# Patient Record
Sex: Female | Born: 1935 | Race: Black or African American | Hispanic: No | Marital: Single
Health system: Southern US, Community
[De-identification: ages and names within clinical notes are randomized; demographics above are authoritative.]

---

## 2004-10-31 ENCOUNTER — Ambulatory Visit: Payer: Self-pay

## 2005-08-27 ENCOUNTER — Emergency Department: Payer: Self-pay | Admitting: General Practice

## 2005-12-16 ENCOUNTER — Ambulatory Visit: Payer: Self-pay

## 2006-12-15 ENCOUNTER — Ambulatory Visit: Payer: Self-pay | Admitting: Gastroenterology

## 2006-12-22 ENCOUNTER — Ambulatory Visit: Payer: Self-pay

## 2008-01-06 ENCOUNTER — Ambulatory Visit: Payer: Self-pay | Admitting: Family Medicine

## 2008-12-25 ENCOUNTER — Ambulatory Visit: Payer: Self-pay | Admitting: Unknown Physician Specialty

## 2009-01-15 ENCOUNTER — Ambulatory Visit: Payer: Self-pay | Admitting: Family Medicine

## 2010-02-13 ENCOUNTER — Ambulatory Visit: Payer: Self-pay | Admitting: Family Medicine

## 2011-03-18 ENCOUNTER — Ambulatory Visit: Payer: Self-pay | Admitting: Family Medicine

## 2011-04-16 ENCOUNTER — Ambulatory Visit: Payer: Self-pay | Admitting: Family Medicine

## 2011-05-22 ENCOUNTER — Ambulatory Visit: Payer: Self-pay | Admitting: Unknown Physician Specialty

## 2011-05-26 ENCOUNTER — Ambulatory Visit: Payer: Self-pay | Admitting: Unknown Physician Specialty

## 2011-06-06 ENCOUNTER — Ambulatory Visit: Payer: Self-pay | Admitting: Obstetrics and Gynecology

## 2011-06-10 ENCOUNTER — Ambulatory Visit: Payer: Self-pay | Admitting: Gynecologic Oncology

## 2011-06-11 LAB — CEA: CEA: 1.7 ng/mL (ref 0.0–4.7)

## 2011-06-12 ENCOUNTER — Ambulatory Visit: Payer: Self-pay | Admitting: Gynecologic Oncology

## 2011-06-12 ENCOUNTER — Ambulatory Visit: Payer: Self-pay | Admitting: Unknown Physician Specialty

## 2011-06-16 ENCOUNTER — Ambulatory Visit: Payer: Self-pay | Admitting: Gynecologic Oncology

## 2011-06-16 LAB — PATHOLOGY REPORT

## 2011-06-18 LAB — PATHOLOGY REPORT

## 2011-06-21 ENCOUNTER — Inpatient Hospital Stay: Payer: Self-pay | Admitting: Internal Medicine

## 2011-06-23 LAB — CA 125: CA 125: 109 U/mL — ABNORMAL HIGH (ref 0.0–34.0)

## 2011-07-01 ENCOUNTER — Ambulatory Visit: Payer: Self-pay | Admitting: Gynecologic Oncology

## 2011-07-08 ENCOUNTER — Inpatient Hospital Stay: Payer: Self-pay | Admitting: Surgery

## 2011-07-12 ENCOUNTER — Ambulatory Visit: Payer: Self-pay | Admitting: Gynecologic Oncology

## 2011-08-12 ENCOUNTER — Ambulatory Visit: Payer: Self-pay | Admitting: Gynecologic Oncology

## 2011-08-14 LAB — COMPREHENSIVE METABOLIC PANEL
Albumin: 3.6 g/dL (ref 3.4–5.0)
Alkaline Phosphatase: 84 U/L (ref 50–136)
Anion Gap: 9 (ref 7–16)
BUN: 5 mg/dL — ABNORMAL LOW (ref 7–18)
Bilirubin,Total: 0.5 mg/dL (ref 0.2–1.0)
Calcium, Total: 8.7 mg/dL (ref 8.5–10.1)
Co2: 29 mmol/L (ref 21–32)
Creatinine: 0.77 mg/dL (ref 0.60–1.30)
EGFR (African American): >60
EGFR (Non-African Amer.): >60
Glucose: 130 mg/dL — ABNORMAL HIGH (ref 65–99)
Osmolality: 269 (ref 275–301)
Potassium: 3 mmol/L — ABNORMAL LOW (ref 3.5–5.1)
SGPT (ALT): 26 U/L
Sodium: 135 mmol/L — ABNORMAL LOW (ref 136–145)

## 2011-08-14 LAB — CBC CANCER CENTER
Basophil #: 0 x10 3/mm (ref 0.0–0.1)
Basophil %: 1 %
Eosinophil #: 0 x10 3/mm (ref 0.0–0.7)
HCT: 30.9 % — ABNORMAL LOW (ref 35.0–47.0)
HGB: 10.2 g/dL — ABNORMAL LOW (ref 12.0–16.0)
Lymphocyte #: 1.6 x10 3/mm (ref 1.0–3.6)
Lymphocyte %: 48.7 %
MCH: 26.9 pg (ref 26.0–34.0)
MCHC: 32.9 g/dL (ref 32.0–36.0)
MCV: 82 fL (ref 80–100)
Monocyte #: 0.4 x10 3/mm (ref 0.0–0.7)
Neutrophil #: 1.2 x10 3/mm — ABNORMAL LOW (ref 1.4–6.5)
RBC: 3.78 10*6/uL — ABNORMAL LOW (ref 3.80–5.20)

## 2011-08-21 LAB — CBC CANCER CENTER
Basophil #: 0.1 x10 3/mm (ref 0.0–0.1)
Eosinophil #: 0 x10 3/mm (ref 0.0–0.7)
HGB: 10 g/dL — ABNORMAL LOW (ref 12.0–16.0)
Lymphocyte #: 2.2 x10 3/mm (ref 1.0–3.6)
Lymphocyte %: 52.4 %
MCH: 27.1 pg (ref 26.0–34.0)
MCHC: 33.2 g/dL (ref 32.0–36.0)
MCV: 82 fL (ref 80–100)
Monocyte #: 0.4 x10 3/mm (ref 0.0–0.7)
Neutrophil %: 35.4 %
Platelet: 323 x10 3/mm (ref 150–440)
RBC: 3.69 10*6/uL — ABNORMAL LOW (ref 3.80–5.20)
RDW: 20.9 % — ABNORMAL HIGH (ref 11.5–14.5)

## 2011-08-21 LAB — COMPREHENSIVE METABOLIC PANEL
Albumin: 3.5 g/dL (ref 3.4–5.0)
Alkaline Phosphatase: 78 U/L (ref 50–136)
Calcium, Total: 8.7 mg/dL (ref 8.5–10.1)
Chloride: 98 mmol/L (ref 98–107)
Co2: 26 mmol/L (ref 21–32)
Creatinine: 0.83 mg/dL (ref 0.60–1.30)
EGFR (Non-African Amer.): >60
Osmolality: 272 (ref 275–301)
Potassium: 3.5 mmol/L (ref 3.5–5.1)
SGOT(AST): 18 U/L (ref 15–37)
Total Protein: 7.1 g/dL (ref 6.4–8.2)

## 2011-08-28 LAB — BASIC METABOLIC PANEL
Anion Gap: 10 (ref 7–16)
BUN: 6 mg/dL — ABNORMAL LOW (ref 7–18)
Calcium, Total: 8.3 mg/dL — ABNORMAL LOW (ref 8.5–10.1)
Co2: 29 mmol/L (ref 21–32)
Creatinine: 0.73 mg/dL (ref 0.60–1.30)
EGFR (African American): >60
EGFR (Non-African Amer.): >60
Glucose: 124 mg/dL — ABNORMAL HIGH (ref 65–99)
Osmolality: 262 (ref 275–301)
Potassium: 3 mmol/L — ABNORMAL LOW (ref 3.5–5.1)

## 2011-08-28 LAB — CBC CANCER CENTER
Basophil #: 0 x10 3/mm (ref 0.0–0.1)
Basophil %: 0.6 %
Eosinophil #: 0 x10 3/mm (ref 0.0–0.7)
HCT: 28.2 % — ABNORMAL LOW (ref 35.0–47.0)
HGB: 9.4 g/dL — ABNORMAL LOW (ref 12.0–16.0)
Lymphocyte #: 2.1 x10 3/mm (ref 1.0–3.6)
MCH: 27 pg (ref 26.0–34.0)
MCV: 81 fL (ref 80–100)
Monocyte #: 0.3 x10 3/mm (ref 0.0–0.7)
Monocyte %: 8.1 %
Neutrophil #: 1.1 x10 3/mm — ABNORMAL LOW (ref 1.4–6.5)
RBC: 3.48 10*6/uL — ABNORMAL LOW (ref 3.80–5.20)
RDW: 19.9 % — ABNORMAL HIGH (ref 11.5–14.5)
WBC: 3.5 x10 3/mm — ABNORMAL LOW (ref 3.6–11.0)

## 2011-09-04 LAB — CBC CANCER CENTER
Basophil #: 0 x10 3/mm (ref 0.0–0.1)
Basophil %: 0.2 %
Eosinophil #: 0 x10 3/mm (ref 0.0–0.7)
Eosinophil %: 0 %
HCT: 28.9 % — ABNORMAL LOW (ref 35.0–47.0)
HGB: 9.8 g/dL — ABNORMAL LOW (ref 12.0–16.0)
Lymphocyte #: 2 x10 3/mm (ref 1.0–3.6)
MCH: 27.6 pg (ref 26.0–34.0)
MCV: 82 fL (ref 80–100)
Monocyte #: 0.7 x10 3/mm (ref 0.0–0.7)
Monocyte %: 16.3 %
Neutrophil #: 1.3 x10 3/mm — ABNORMAL LOW (ref 1.4–6.5)
Neutrophil %: 32.5 %
Platelet: 177 x10 3/mm (ref 150–440)
RBC: 3.53 10*6/uL — ABNORMAL LOW (ref 3.80–5.20)
RDW: 21.5 % — ABNORMAL HIGH (ref 11.5–14.5)
WBC: 4 x10 3/mm (ref 3.6–11.0)

## 2011-09-04 LAB — COMPREHENSIVE METABOLIC PANEL
Albumin: 3.6 g/dL (ref 3.4–5.0)
Alkaline Phosphatase: 84 U/L (ref 50–136)
Calcium, Total: 8.3 mg/dL — ABNORMAL LOW (ref 8.5–10.1)
Chloride: 98 mmol/L (ref 98–107)
Co2: 27 mmol/L (ref 21–32)
EGFR (African American): >60
EGFR (Non-African Amer.): >60
Glucose: 112 mg/dL — ABNORMAL HIGH (ref 65–99)
Osmolality: 264 (ref 275–301)
SGOT(AST): 18 U/L (ref 15–37)
SGPT (ALT): 26 U/L
Sodium: 133 mmol/L — ABNORMAL LOW (ref 136–145)

## 2011-09-11 LAB — CBC CANCER CENTER
Basophil #: 0 x10 3/mm (ref 0.0–0.1)
Basophil %: 0.1 %
Eosinophil #: 0 x10 3/mm (ref 0.0–0.7)
Eosinophil %: 0.6 %
HCT: 31.6 % — ABNORMAL LOW (ref 35.0–47.0)
HGB: 10.6 g/dL — ABNORMAL LOW (ref 12.0–16.0)
Lymphocyte %: 43.8 %
MCH: 28 pg (ref 26.0–34.0)
MCV: 84 fL (ref 80–100)
Monocyte #: 0.3 x10 3/mm (ref 0.0–0.7)
Monocyte %: 7.2 %
Neutrophil #: 2.2 x10 3/mm (ref 1.4–6.5)
RBC: 3.77 10*6/uL — ABNORMAL LOW (ref 3.80–5.20)
WBC: 4.5 x10 3/mm (ref 3.6–11.0)

## 2011-09-11 LAB — COMPREHENSIVE METABOLIC PANEL
Albumin: 3.9 g/dL (ref 3.4–5.0)
Alkaline Phosphatase: 79 U/L (ref 50–136)
Anion Gap: 8 (ref 7–16)
Calcium, Total: 8.8 mg/dL (ref 8.5–10.1)
Co2: 28 mmol/L (ref 21–32)
Creatinine: 0.76 mg/dL (ref 0.60–1.30)
Glucose: 114 mg/dL — ABNORMAL HIGH (ref 65–99)
Osmolality: 261 (ref 275–301)
Potassium: 3.4 mmol/L — ABNORMAL LOW (ref 3.5–5.1)
SGPT (ALT): 22 U/L
Total Protein: 7.4 g/dL (ref 6.4–8.2)

## 2011-09-12 ENCOUNTER — Ambulatory Visit: Payer: Self-pay | Admitting: Gynecologic Oncology

## 2011-09-25 LAB — CBC CANCER CENTER
Basophil %: 0.1 %
Eosinophil %: 0.4 %
HGB: 10.8 g/dL — ABNORMAL LOW (ref 12.0–16.0)
Lymphocyte %: 44.8 %
MCHC: 33.6 g/dL (ref 32.0–36.0)
MCV: 85 fL (ref 80–100)
Monocyte #: 0.3 x10 3/mm (ref 0.0–0.7)
Neutrophil %: 45 %
Platelet: 278 x10 3/mm (ref 150–440)
RBC: 3.8 10*6/uL (ref 3.80–5.20)
RDW: 24.5 % — ABNORMAL HIGH (ref 11.5–14.5)
WBC: 3.1 x10 3/mm — ABNORMAL LOW (ref 3.6–11.0)

## 2011-09-25 LAB — COMPREHENSIVE METABOLIC PANEL
Albumin: 3.9 g/dL (ref 3.4–5.0)
Alkaline Phosphatase: 79 U/L (ref 50–136)
BUN: 6 mg/dL — ABNORMAL LOW (ref 7–18)
Bilirubin,Total: 0.5 mg/dL (ref 0.2–1.0)
Chloride: 94 mmol/L — ABNORMAL LOW (ref 98–107)
Co2: 25 mmol/L (ref 21–32)
Creatinine: 0.92 mg/dL (ref 0.60–1.30)
EGFR (African American): >60
Glucose: 131 mg/dL — ABNORMAL HIGH (ref 65–99)
Osmolality: 262 (ref 275–301)
SGPT (ALT): 27 U/L
Total Protein: 7.4 g/dL (ref 6.4–8.2)

## 2011-10-02 LAB — COMPREHENSIVE METABOLIC PANEL
Anion Gap: 11 (ref 7–16)
BUN: 7 mg/dL (ref 7–18)
Calcium, Total: 8.5 mg/dL (ref 8.5–10.1)
Co2: 25 mmol/L (ref 21–32)
EGFR (African American): >60
EGFR (Non-African Amer.): >60
Osmolality: 260 (ref 275–301)
Potassium: 3.6 mmol/L (ref 3.5–5.1)
SGOT(AST): 20 U/L (ref 15–37)
Total Protein: 7.4 g/dL (ref 6.4–8.2)

## 2011-10-02 LAB — CBC CANCER CENTER
Basophil %: 0.6 %
Eosinophil %: 0.2 %
HGB: 10.5 g/dL — ABNORMAL LOW (ref 12.0–16.0)
Lymphocyte #: 1.4 x10 3/mm (ref 1.0–3.6)
MCH: 29 pg (ref 26.0–34.0)
MCV: 85 fL (ref 80–100)
Monocyte #: 0.3 x10 3/mm (ref 0.0–0.7)
Neutrophil %: 39.1 %
RBC: 3.61 10*6/uL — ABNORMAL LOW (ref 3.80–5.20)
RDW: 23.2 % — ABNORMAL HIGH (ref 11.5–14.5)
WBC: 2.8 x10 3/mm — ABNORMAL LOW (ref 3.6–11.0)

## 2011-10-09 LAB — CBC CANCER CENTER
Eosinophil %: 0.2 %
HCT: 29.5 % — ABNORMAL LOW (ref 35.0–47.0)
Lymphocyte %: 55.9 %
MCHC: 33.5 g/dL (ref 32.0–36.0)
MCV: 85 fL (ref 80–100)
Monocyte #: 0.4 x10 3/mm (ref 0.0–0.7)
Monocyte %: 11.3 %
Neutrophil #: 1 x10 3/mm — ABNORMAL LOW (ref 1.4–6.5)
Neutrophil %: 32 %
Platelet: 185 x10 3/mm (ref 150–440)
RBC: 3.46 10*6/uL — ABNORMAL LOW (ref 3.80–5.20)
RDW: 23.5 % — ABNORMAL HIGH (ref 11.5–14.5)
WBC: 3.2 x10 3/mm — ABNORMAL LOW (ref 3.6–11.0)

## 2011-10-09 LAB — COMPREHENSIVE METABOLIC PANEL
Anion Gap: 10 (ref 7–16)
BUN: 4 mg/dL — ABNORMAL LOW (ref 7–18)
Bilirubin,Total: 0.3 mg/dL (ref 0.2–1.0)
Calcium, Total: 8.6 mg/dL (ref 8.5–10.1)
Co2: 27 mmol/L (ref 21–32)
EGFR (African American): >60
EGFR (Non-African Amer.): >60
Glucose: 122 mg/dL — ABNORMAL HIGH (ref 65–99)
Osmolality: 265 (ref 275–301)
SGPT (ALT): 27 U/L

## 2011-10-10 ENCOUNTER — Ambulatory Visit: Payer: Self-pay | Admitting: Gynecologic Oncology

## 2011-10-23 LAB — CBC CANCER CENTER
Basophil #: 0 x10 3/mm (ref 0.0–0.1)
Basophil %: 0.4 %
Eosinophil #: 0 x10 3/mm (ref 0.0–0.7)
HCT: 28.5 % — ABNORMAL LOW (ref 35.0–47.0)
MCH: 29.4 pg (ref 26.0–34.0)
MCHC: 33 g/dL (ref 32.0–36.0)
MCV: 89 fL (ref 80–100)
Monocyte #: 0.3 x10 3/mm (ref 0.0–0.7)
Monocyte %: 8.8 %
Neutrophil %: 57.2 %
Platelet: 162 x10 3/mm (ref 150–440)
RBC: 3.2 10*6/uL — ABNORMAL LOW (ref 3.80–5.20)
RDW: 24.3 % — ABNORMAL HIGH (ref 11.5–14.5)
WBC: 4 x10 3/mm (ref 3.6–11.0)

## 2011-10-23 LAB — COMPREHENSIVE METABOLIC PANEL
Anion Gap: 9 (ref 7–16)
BUN: 6 mg/dL — ABNORMAL LOW (ref 7–18)
Calcium, Total: 8.3 mg/dL — ABNORMAL LOW (ref 8.5–10.1)
Chloride: 99 mmol/L (ref 98–107)
Co2: 28 mmol/L (ref 21–32)
Potassium: 3.6 mmol/L (ref 3.5–5.1)
SGOT(AST): 18 U/L (ref 15–37)
SGPT (ALT): 23 U/L
Total Protein: 7.1 g/dL (ref 6.4–8.2)

## 2011-10-30 LAB — CBC CANCER CENTER
Basophil #: 0 x10 3/mm (ref 0.0–0.1)
Eosinophil %: 0.6 %
Lymphocyte #: 1.4 x10 3/mm (ref 1.0–3.6)
Lymphocyte %: 48.2 %
MCHC: 34.1 g/dL (ref 32.0–36.0)
MCV: 88 fL (ref 80–100)
Monocyte %: 8.5 %
Neutrophil #: 1.2 x10 3/mm — ABNORMAL LOW (ref 1.4–6.5)
Neutrophil %: 42.4 %
Platelet: 237 x10 3/mm (ref 150–440)
RBC: 3.2 10*6/uL — ABNORMAL LOW (ref 3.80–5.20)
RDW: 22 % — ABNORMAL HIGH (ref 11.5–14.5)
WBC: 2.8 x10 3/mm — ABNORMAL LOW (ref 3.6–11.0)

## 2011-10-30 LAB — COMPREHENSIVE METABOLIC PANEL
Anion Gap: 10 (ref 7–16)
Bilirubin,Total: 0.5 mg/dL (ref 0.2–1.0)
Chloride: 97 mmol/L — ABNORMAL LOW (ref 98–107)
Co2: 28 mmol/L (ref 21–32)
Creatinine: 0.82 mg/dL (ref 0.60–1.30)
Glucose: 128 mg/dL — ABNORMAL HIGH (ref 65–99)
Potassium: 3.2 mmol/L — ABNORMAL LOW (ref 3.5–5.1)
SGPT (ALT): 22 U/L
Sodium: 135 mmol/L — ABNORMAL LOW (ref 136–145)

## 2011-10-31 LAB — CA 125: CA 125: 11.4 U/mL (ref 0.0–34.0)

## 2011-11-06 LAB — COMPREHENSIVE METABOLIC PANEL
Albumin: 3.8 g/dL (ref 3.4–5.0)
Alkaline Phosphatase: 79 U/L (ref 50–136)
Anion Gap: 9 (ref 7–16)
BUN: 12 mg/dL (ref 7–18)
Bilirubin,Total: 0.3 mg/dL (ref 0.2–1.0)
Calcium, Total: 8.7 mg/dL (ref 8.5–10.1)
Chloride: 98 mmol/L (ref 98–107)
EGFR (Non-African Amer.): >60
Potassium: 3.6 mmol/L (ref 3.5–5.1)
SGOT(AST): 20 U/L (ref 15–37)
SGPT (ALT): 24 U/L
Sodium: 136 mmol/L (ref 136–145)
Total Protein: 7.3 g/dL (ref 6.4–8.2)

## 2011-11-06 LAB — CBC CANCER CENTER
Eosinophil #: 0 x10 3/mm (ref 0.0–0.7)
HCT: 26.3 % — ABNORMAL LOW (ref 35.0–47.0)
Lymphocyte #: 1.6 x10 3/mm (ref 1.0–3.6)
Lymphocyte %: 53 %
MCH: 30.4 pg (ref 26.0–34.0)
MCHC: 34.2 g/dL (ref 32.0–36.0)
MCV: 89 fL (ref 80–100)
Monocyte #: 0.4 x10 3/mm (ref 0.0–0.7)
Monocyte %: 12.2 %
Platelet: 157 x10 3/mm (ref 150–440)
RBC: 2.95 10*6/uL — ABNORMAL LOW (ref 3.80–5.20)
RDW: 21.6 % — ABNORMAL HIGH (ref 11.5–14.5)
WBC: 3 x10 3/mm — ABNORMAL LOW (ref 3.6–11.0)

## 2011-11-10 ENCOUNTER — Ambulatory Visit: Payer: Self-pay | Admitting: Gynecologic Oncology

## 2011-11-20 LAB — CBC CANCER CENTER
Basophil #: 0 x10 3/mm (ref 0.0–0.1)
Basophil %: 0.3 %
Eosinophil #: 0 x10 3/mm (ref 0.0–0.7)
Eosinophil %: 0.9 %
HCT: 26 % — ABNORMAL LOW (ref 35.0–47.0)
HGB: 8.7 g/dL — ABNORMAL LOW (ref 12.0–16.0)
Lymphocyte #: 2.2 x10 3/mm (ref 1.0–3.6)
Lymphocyte %: 38.1 %
MCH: 31.2 pg (ref 26.0–34.0)
MCHC: 33.7 g/dL (ref 32.0–36.0)
Monocyte #: 0.4 x10 3/mm (ref 0.2–0.9)
Neutrophil #: 3 x10 3/mm (ref 1.4–6.5)
Platelet: 163 x10 3/mm (ref 150–440)
RBC: 2.8 10*6/uL — ABNORMAL LOW (ref 3.80–5.20)
RDW: 22.7 % — ABNORMAL HIGH (ref 11.5–14.5)
WBC: 5.6 x10 3/mm (ref 3.6–11.0)

## 2011-11-20 LAB — COMPREHENSIVE METABOLIC PANEL
Albumin: 3.7 g/dL (ref 3.4–5.0)
Anion Gap: 7 (ref 7–16)
BUN: 9 mg/dL (ref 7–18)
Bilirubin,Total: 0.3 mg/dL (ref 0.2–1.0)
Chloride: 99 mmol/L (ref 98–107)
Co2: 28 mmol/L (ref 21–32)
EGFR (Non-African Amer.): >60
Glucose: 113 mg/dL — ABNORMAL HIGH (ref 65–99)
SGOT(AST): 16 U/L (ref 15–37)
SGPT (ALT): 20 U/L
Total Protein: 7.2 g/dL (ref 6.4–8.2)

## 2011-11-27 LAB — COMPREHENSIVE METABOLIC PANEL
Albumin: 3.8 g/dL (ref 3.4–5.0)
Alkaline Phosphatase: 83 U/L (ref 50–136)
Anion Gap: 9 (ref 7–16)
BUN: 11 mg/dL (ref 7–18)
Chloride: 96 mmol/L — ABNORMAL LOW (ref 98–107)
Co2: 30 mmol/L (ref 21–32)
Glucose: 107 mg/dL — ABNORMAL HIGH (ref 65–99)
Osmolality: 270 (ref 275–301)
SGPT (ALT): 23 U/L
Sodium: 135 mmol/L — ABNORMAL LOW (ref 136–145)
Total Protein: 7.3 g/dL (ref 6.4–8.2)

## 2011-11-27 LAB — CBC CANCER CENTER
Basophil #: 0 x10 3/mm (ref 0.0–0.1)
Basophil %: 0.2 %
Eosinophil #: 0 x10 3/mm (ref 0.0–0.7)
Eosinophil %: 0.9 %
Lymphocyte #: 2.2 x10 3/mm (ref 1.0–3.6)
Lymphocyte %: 46.4 %
MCH: 31.5 pg (ref 26.0–34.0)
Monocyte #: 0.4 x10 3/mm (ref 0.2–0.9)
Monocyte %: 9.1 %
Neutrophil #: 2 x10 3/mm (ref 1.4–6.5)
Platelet: 356 x10 3/mm (ref 150–440)
RDW: 20.1 % — ABNORMAL HIGH (ref 11.5–14.5)
WBC: 4.7 x10 3/mm (ref 3.6–11.0)

## 2011-12-04 LAB — COMPREHENSIVE METABOLIC PANEL
Alkaline Phosphatase: 80 U/L (ref 50–136)
Anion Gap: 10 (ref 7–16)
BUN: 12 mg/dL (ref 7–18)
Bilirubin,Total: 0.4 mg/dL (ref 0.2–1.0)
Co2: 28 mmol/L (ref 21–32)
Creatinine: 0.8 mg/dL (ref 0.60–1.30)
EGFR (African American): >60
Glucose: 120 mg/dL — ABNORMAL HIGH (ref 65–99)
Potassium: 3.5 mmol/L (ref 3.5–5.1)
SGOT(AST): 19 U/L (ref 15–37)
SGPT (ALT): 23 U/L
Sodium: 138 mmol/L (ref 136–145)

## 2011-12-04 LAB — CBC CANCER CENTER
Eosinophil #: 0 x10 3/mm (ref 0.0–0.7)
HCT: 25 % — ABNORMAL LOW (ref 35.0–47.0)
HGB: 8.2 g/dL — ABNORMAL LOW (ref 12.0–16.0)
Lymphocyte #: 1.8 x10 3/mm (ref 1.0–3.6)
Lymphocyte %: 40.5 %
MCHC: 32.9 g/dL (ref 32.0–36.0)
MCV: 95 fL (ref 80–100)
Monocyte %: 11 %
Neutrophil #: 2.1 x10 3/mm (ref 1.4–6.5)
Neutrophil %: 48 %
Platelet: 224 x10 3/mm (ref 150–440)
RBC: 2.64 10*6/uL — ABNORMAL LOW (ref 3.80–5.20)
RDW: 20.2 % — ABNORMAL HIGH (ref 11.5–14.5)
WBC: 4.4 x10 3/mm (ref 3.6–11.0)

## 2011-12-10 ENCOUNTER — Ambulatory Visit: Payer: Self-pay | Admitting: Gynecologic Oncology

## 2011-12-18 LAB — CBC CANCER CENTER
Basophil #: 0 x10 3/mm (ref 0.0–0.1)
Eosinophil #: 0.1 x10 3/mm (ref 0.0–0.7)
Eosinophil %: 0.9 %
MCHC: 32.8 g/dL (ref 32.0–36.0)
MCV: 98 fL (ref 80–100)
Monocyte #: 0.5 x10 3/mm (ref 0.2–0.9)
Monocyte %: 8.6 %
Neutrophil #: 3.2 x10 3/mm (ref 1.4–6.5)
Neutrophil %: 57.8 %
Platelet: 159 x10 3/mm (ref 150–440)
WBC: 5.5 x10 3/mm (ref 3.6–11.0)

## 2011-12-18 LAB — COMPREHENSIVE METABOLIC PANEL
Anion Gap: 11 (ref 7–16)
Bilirubin,Total: 0.4 mg/dL (ref 0.2–1.0)
Creatinine: 0.86 mg/dL (ref 0.60–1.30)
EGFR (African American): >60
EGFR (Non-African Amer.): >60
Glucose: 115 mg/dL — ABNORMAL HIGH (ref 65–99)
Osmolality: 278 (ref 275–301)
Potassium: 3.4 mmol/L — ABNORMAL LOW (ref 3.5–5.1)
Total Protein: 7.4 g/dL (ref 6.4–8.2)

## 2011-12-25 LAB — COMPREHENSIVE METABOLIC PANEL
Albumin: 3.7 g/dL (ref 3.4–5.0)
Anion Gap: 11 (ref 7–16)
BUN: 19 mg/dL — ABNORMAL HIGH (ref 7–18)
Bilirubin,Total: 0.6 mg/dL (ref 0.2–1.0)
Calcium, Total: 8.4 mg/dL — ABNORMAL LOW (ref 8.5–10.1)
Co2: 28 mmol/L (ref 21–32)
Creatinine: 0.78 mg/dL (ref 0.60–1.30)
EGFR (African American): >60
SGPT (ALT): 22 U/L
Sodium: 139 mmol/L (ref 136–145)
Total Protein: 7.2 g/dL (ref 6.4–8.2)

## 2011-12-25 LAB — CBC CANCER CENTER
Basophil %: 0.4 %
Eosinophil #: 0 x10 3/mm (ref 0.0–0.7)
HGB: 8.3 g/dL — ABNORMAL LOW (ref 12.0–16.0)
Lymphocyte #: 1.5 x10 3/mm (ref 1.0–3.6)
Lymphocyte %: 47 %
MCHC: 33.4 g/dL (ref 32.0–36.0)
MCV: 97 fL (ref 80–100)
Neutrophil #: 1.4 x10 3/mm (ref 1.4–6.5)
Platelet: 240 x10 3/mm (ref 150–440)
RBC: 2.57 10*6/uL — ABNORMAL LOW (ref 3.80–5.20)
RDW: 18.2 % — ABNORMAL HIGH (ref 11.5–14.5)

## 2012-01-01 LAB — COMPREHENSIVE METABOLIC PANEL
Albumin: 3.8 g/dL (ref 3.4–5.0)
BUN: 9 mg/dL (ref 7–18)
Calcium, Total: 8.4 mg/dL — ABNORMAL LOW (ref 8.5–10.1)
Chloride: 100 mmol/L (ref 98–107)
EGFR (African American): >60
Potassium: 3.5 mmol/L (ref 3.5–5.1)
SGOT(AST): 17 U/L (ref 15–37)
SGPT (ALT): 22 U/L
Sodium: 137 mmol/L (ref 136–145)
Total Protein: 7.3 g/dL (ref 6.4–8.2)

## 2012-01-01 LAB — CBC CANCER CENTER
Basophil #: 0 x10 3/mm (ref 0.0–0.1)
Eosinophil #: 0 x10 3/mm (ref 0.0–0.7)
HCT: 24.6 % — ABNORMAL LOW (ref 35.0–47.0)
HGB: 8.2 g/dL — ABNORMAL LOW (ref 12.0–16.0)
Lymphocyte %: 53.2 %
MCHC: 33.2 g/dL (ref 32.0–36.0)
Monocyte %: 13 %
Neutrophil #: 1.1 x10 3/mm — ABNORMAL LOW (ref 1.4–6.5)
Neutrophil %: 32.9 %
RDW: 17.7 % — ABNORMAL HIGH (ref 11.5–14.5)
WBC: 3.3 x10 3/mm — ABNORMAL LOW (ref 3.6–11.0)

## 2012-01-10 ENCOUNTER — Ambulatory Visit: Payer: Self-pay | Admitting: Gynecologic Oncology

## 2012-02-11 ENCOUNTER — Ambulatory Visit: Payer: Self-pay | Admitting: Oncology

## 2012-02-11 LAB — COMPREHENSIVE METABOLIC PANEL
Albumin: 4.1 g/dL (ref 3.4–5.0)
Anion Gap: 9 (ref 7–16)
Calcium, Total: 9.3 mg/dL (ref 8.5–10.1)
Chloride: 103 mmol/L (ref 98–107)
Co2: 30 mmol/L (ref 21–32)
Creatinine: 0.87 mg/dL (ref 0.60–1.30)
EGFR (Non-African Amer.): >60
Glucose: 96 mg/dL (ref 65–99)
Osmolality: 284 (ref 275–301)
Potassium: 3.7 mmol/L (ref 3.5–5.1)
Sodium: 142 mmol/L (ref 136–145)

## 2012-02-11 LAB — CBC CANCER CENTER
Basophil %: 0.5 %
Eosinophil #: 0.1 x10 3/mm (ref 0.0–0.7)
HGB: 10.9 g/dL — ABNORMAL LOW (ref 12.0–16.0)
Lymphocyte #: 1.8 x10 3/mm (ref 1.0–3.6)
MCHC: 32.3 g/dL (ref 32.0–36.0)
MCV: 95 fL (ref 80–100)
Monocyte #: 0.4 x10 3/mm (ref 0.2–0.9)
Monocyte %: 7.7 %
Neutrophil #: 2.5 x10 3/mm (ref 1.4–6.5)
Neutrophil %: 51.2 %
Platelet: 229 x10 3/mm (ref 150–440)
RBC: 3.54 10*6/uL — ABNORMAL LOW (ref 3.80–5.20)
WBC: 4.8 x10 3/mm (ref 3.6–11.0)

## 2012-02-13 LAB — CA 125: CA 125: 12.2 U/mL (ref 0.0–34.0)

## 2012-03-11 ENCOUNTER — Ambulatory Visit: Payer: Self-pay | Admitting: Oncology

## 2012-04-01 ENCOUNTER — Ambulatory Visit: Payer: Self-pay | Admitting: Family Medicine

## 2012-04-11 ENCOUNTER — Ambulatory Visit: Payer: Self-pay | Admitting: Oncology

## 2012-04-29 ENCOUNTER — Emergency Department: Payer: Self-pay | Admitting: Emergency Medicine

## 2012-04-29 LAB — COMPREHENSIVE METABOLIC PANEL
Albumin: 3.6 g/dL (ref 3.4–5.0)
Alkaline Phosphatase: 78 U/L (ref 50–136)
BUN: 9 mg/dL (ref 7–18)
Bilirubin,Total: 0.4 mg/dL (ref 0.2–1.0)
Creatinine: 0.89 mg/dL (ref 0.60–1.30)
EGFR (Non-African Amer.): >60
Glucose: 129 mg/dL — ABNORMAL HIGH (ref 65–99)
Osmolality: 259 (ref 275–301)
Potassium: 3.8 mmol/L (ref 3.5–5.1)
SGPT (ALT): 17 U/L (ref 12–78)
Sodium: 129 mmol/L — ABNORMAL LOW (ref 136–145)
Total Protein: 7.6 g/dL (ref 6.4–8.2)

## 2012-04-29 LAB — URINALYSIS, COMPLETE
Bacteria: NONE SEEN
Bilirubin,UR: NEGATIVE
Blood: NEGATIVE
Glucose,UR: NEGATIVE mg/dL (ref 0–75)
Ketone: NEGATIVE
Ph: 6 (ref 4.5–8.0)
Protein: NEGATIVE
RBC,UR: 1 /HPF (ref 0–5)
Specific Gravity: 1.016 (ref 1.003–1.030)
Squamous Epithelial: 2

## 2012-04-29 LAB — CBC
HCT: 32.7 % — ABNORMAL LOW (ref 35.0–47.0)
HGB: 10.5 g/dL — ABNORMAL LOW (ref 12.0–16.0)
MCHC: 32.2 g/dL (ref 32.0–36.0)
Platelet: 290 10*3/uL (ref 150–440)
RBC: 4.05 10*6/uL (ref 3.80–5.20)
RDW: 15.8 % — ABNORMAL HIGH (ref 11.5–14.5)
WBC: 11.6 10*3/uL — ABNORMAL HIGH (ref 3.6–11.0)

## 2012-05-11 ENCOUNTER — Ambulatory Visit: Payer: Self-pay | Admitting: Oncology

## 2012-05-13 ENCOUNTER — Inpatient Hospital Stay: Payer: Self-pay | Admitting: Oncology

## 2012-05-13 LAB — CBC WITH DIFFERENTIAL/PLATELET
Basophil #: 0 10*3/uL (ref 0.0–0.1)
Eosinophil #: 0 10*3/uL (ref 0.0–0.7)
HCT: 28.7 % — ABNORMAL LOW (ref 35.0–47.0)
HGB: 9.9 g/dL — ABNORMAL LOW (ref 12.0–16.0)
Lymphocyte #: 1.5 10*3/uL (ref 1.0–3.6)
Lymphocyte %: 19.7 %
MCH: 26.6 pg (ref 26.0–34.0)
MCHC: 34.4 g/dL (ref 32.0–36.0)
MCV: 77 fL — ABNORMAL LOW (ref 80–100)
Monocyte #: 0.9 x10 3/mm (ref 0.2–0.9)
Monocyte %: 12.3 %
Neutrophil #: 5 10*3/uL (ref 1.4–6.5)
Platelet: 326 10*3/uL (ref 150–440)
RDW: 15.5 % — ABNORMAL HIGH (ref 11.5–14.5)
WBC: 7.5 10*3/uL (ref 3.6–11.0)

## 2012-05-13 LAB — URINALYSIS, COMPLETE
Blood: NEGATIVE
Glucose,UR: NEGATIVE mg/dL (ref 0–75)
Nitrite: NEGATIVE
Protein: NEGATIVE
WBC UR: 1 /HPF (ref 0–5)

## 2012-05-13 LAB — COMPREHENSIVE METABOLIC PANEL
Albumin: 3.4 g/dL (ref 3.4–5.0)
Alkaline Phosphatase: 81 U/L (ref 50–136)
BUN: 8 mg/dL (ref 7–18)
Calcium, Total: 8.9 mg/dL (ref 8.5–10.1)
Creatinine: 1.1 mg/dL (ref 0.60–1.30)
Glucose: 89 mg/dL (ref 65–99)
Osmolality: 246 (ref 275–301)
Potassium: 4.1 mmol/L (ref 3.5–5.1)
SGOT(AST): 41 U/L — ABNORMAL HIGH (ref 15–37)
SGPT (ALT): 31 U/L (ref 12–78)
Total Protein: 7.2 g/dL (ref 6.4–8.2)

## 2012-05-14 LAB — CBC WITH DIFFERENTIAL/PLATELET
Basophil %: 0.7 %
Eosinophil %: 0.5 %
HCT: 23.2 % — ABNORMAL LOW (ref 35.0–47.0)
Lymphocyte #: 1.3 10*3/uL (ref 1.0–3.6)
MCH: 26.5 pg (ref 26.0–34.0)
Monocyte #: 0.6 x10 3/mm (ref 0.2–0.9)
Monocyte %: 15 %
Neutrophil #: 2.3 10*3/uL (ref 1.4–6.5)
Platelet: 274 10*3/uL (ref 150–440)
RBC: 3.01 10*6/uL — ABNORMAL LOW (ref 3.80–5.20)
WBC: 4.3 10*3/uL (ref 3.6–11.0)

## 2012-05-14 LAB — IRON AND TIBC
Iron Bind.Cap.(Total): 156 ug/dL — ABNORMAL LOW (ref 250–450)
Iron Saturation: 10 %
Unbound Iron-Bind.Cap.: 140 ug/dL

## 2012-05-14 LAB — AMYLASE: Amylase: 24 U/L — ABNORMAL LOW (ref 25–115)

## 2012-05-14 LAB — LIPASE, BLOOD: Lipase: 51 U/L — ABNORMAL LOW (ref 73–393)

## 2012-05-14 LAB — BASIC METABOLIC PANEL
BUN: 6 mg/dL — ABNORMAL LOW (ref 7–18)
Chloride: 93 mmol/L — ABNORMAL LOW (ref 98–107)
Creatinine: 0.95 mg/dL (ref 0.60–1.30)
EGFR (Non-African Amer.): 59 — ABNORMAL LOW
Glucose: 80 mg/dL (ref 65–99)
Osmolality: 256 (ref 275–301)
Potassium: 3.5 mmol/L (ref 3.5–5.1)

## 2012-05-14 LAB — FERRITIN: Ferritin (ARMC): 320 ng/mL (ref 8–388)

## 2012-05-16 LAB — BASIC METABOLIC PANEL
BUN: 3 mg/dL — ABNORMAL LOW (ref 7–18)
Calcium, Total: 8 mg/dL — ABNORMAL LOW (ref 8.5–10.1)
Co2: 23 mmol/L (ref 21–32)
EGFR (African American): >60
EGFR (Non-African Amer.): >60
Glucose: 118 mg/dL — ABNORMAL HIGH (ref 65–99)
Osmolality: 271 (ref 275–301)
Potassium: 3.6 mmol/L (ref 3.5–5.1)
Sodium: 137 mmol/L (ref 136–145)

## 2012-05-17 LAB — BASIC METABOLIC PANEL
BUN: 2 mg/dL — ABNORMAL LOW (ref 7–18)
Calcium, Total: 8.5 mg/dL (ref 8.5–10.1)
Chloride: 104 mmol/L (ref 98–107)
Glucose: 108 mg/dL — ABNORMAL HIGH (ref 65–99)
Osmolality: 269 (ref 275–301)
Potassium: 3.6 mmol/L (ref 3.5–5.1)
Sodium: 136 mmol/L (ref 136–145)

## 2012-05-18 LAB — BASIC METABOLIC PANEL
Chloride: 107 mmol/L (ref 98–107)
EGFR (Non-African Amer.): >60
Potassium: 4.2 mmol/L (ref 3.5–5.1)
Sodium: 138 mmol/L (ref 136–145)

## 2012-05-18 LAB — CBC WITH DIFFERENTIAL/PLATELET
Basophil #: 0 10*3/uL (ref 0.0–0.1)
HCT: 25.1 % — ABNORMAL LOW (ref 35.0–47.0)
HGB: 8.4 g/dL — ABNORMAL LOW (ref 12.0–16.0)
Lymphocyte #: 1.5 10*3/uL (ref 1.0–3.6)
MCH: 26.7 pg (ref 26.0–34.0)
MCHC: 33.6 g/dL (ref 32.0–36.0)
Monocyte #: 0.6 x10 3/mm (ref 0.2–0.9)
Neutrophil %: 62.8 %
RBC: 3.15 10*6/uL — ABNORMAL LOW (ref 3.80–5.20)
WBC: 5.8 10*3/uL (ref 3.6–11.0)

## 2012-05-20 LAB — CBC WITH DIFFERENTIAL/PLATELET
Basophil #: 0 10*3/uL (ref 0.0–0.1)
Basophil %: 0.2 %
HGB: 8.4 g/dL — ABNORMAL LOW (ref 12.0–16.0)
Lymphocyte #: 1.2 10*3/uL (ref 1.0–3.6)
MCH: 26.7 pg (ref 26.0–34.0)
MCHC: 33.7 g/dL (ref 32.0–36.0)
MCV: 79 fL — ABNORMAL LOW (ref 80–100)
Monocyte %: 9.1 %
Neutrophil %: 70.3 %
Platelet: 285 10*3/uL (ref 150–440)
RBC: 3.13 10*6/uL — ABNORMAL LOW (ref 3.80–5.20)
RDW: 16.3 % — ABNORMAL HIGH (ref 11.5–14.5)
WBC: 5.8 10*3/uL (ref 3.6–11.0)

## 2012-05-20 LAB — COMPREHENSIVE METABOLIC PANEL
Albumin: 2.5 g/dL — ABNORMAL LOW (ref 3.4–5.0)
Alkaline Phosphatase: 63 U/L (ref 50–136)
BUN: 4 mg/dL — ABNORMAL LOW (ref 7–18)
Bilirubin,Total: 0.5 mg/dL (ref 0.2–1.0)
Co2: 22 mmol/L (ref 21–32)
Creatinine: 0.71 mg/dL (ref 0.60–1.30)
EGFR (Non-African Amer.): >60
Glucose: 102 mg/dL — ABNORMAL HIGH (ref 65–99)
SGOT(AST): 17 U/L (ref 15–37)
SGPT (ALT): 16 U/L (ref 12–78)
Sodium: 138 mmol/L (ref 136–145)

## 2012-05-20 LAB — MAGNESIUM: Magnesium: 2 mg/dL

## 2012-05-21 LAB — CBC WITH DIFFERENTIAL/PLATELET
Basophil #: 0 10*3/uL (ref 0.0–0.1)
Basophil %: 0.3 %
Eosinophil %: 0.1 %
HCT: 24.1 % — ABNORMAL LOW (ref 35.0–47.0)
Lymphocyte #: 0.6 10*3/uL — ABNORMAL LOW (ref 1.0–3.6)
Lymphocyte %: 14.1 %
MCH: 26.6 pg (ref 26.0–34.0)
MCV: 80 fL (ref 80–100)
Monocyte %: 5 %
Neutrophil #: 3.7 10*3/uL (ref 1.4–6.5)
Platelet: 279 10*3/uL (ref 150–440)
RBC: 3.03 10*6/uL — ABNORMAL LOW (ref 3.80–5.20)

## 2012-05-22 LAB — CBC WITH DIFFERENTIAL/PLATELET
Basophil %: 0.2 %
Eosinophil %: 0 %
HCT: 23 % — ABNORMAL LOW (ref 35.0–47.0)
HGB: 7.8 g/dL — ABNORMAL LOW (ref 12.0–16.0)
Lymphocyte %: 14 %
MCHC: 33.7 g/dL (ref 32.0–36.0)
MCV: 79 fL — ABNORMAL LOW (ref 80–100)
Monocyte %: 4.7 %
Neutrophil #: 3.7 10*3/uL (ref 1.4–6.5)
RBC: 2.91 10*6/uL — ABNORMAL LOW (ref 3.80–5.20)
WBC: 4.5 10*3/uL (ref 3.6–11.0)

## 2012-05-23 LAB — COMPREHENSIVE METABOLIC PANEL
Alkaline Phosphatase: 65 U/L (ref 50–136)
BUN: 10 mg/dL (ref 7–18)
Bilirubin,Total: 0.8 mg/dL (ref 0.2–1.0)
Chloride: 106 mmol/L (ref 98–107)
Creatinine: 0.79 mg/dL (ref 0.60–1.30)
EGFR (Non-African Amer.): >60
Glucose: 90 mg/dL (ref 65–99)
Osmolality: 278 (ref 275–301)
SGOT(AST): 19 U/L (ref 15–37)
SGPT (ALT): 15 U/L (ref 12–78)
Total Protein: 6.1 g/dL — ABNORMAL LOW (ref 6.4–8.2)

## 2012-05-24 LAB — MAGNESIUM: Magnesium: 1.4 mg/dL — ABNORMAL LOW

## 2012-05-24 LAB — CREATININE, SERUM: EGFR (African American): >60

## 2012-05-25 LAB — CBC WITH DIFFERENTIAL/PLATELET
Basophil %: 0.7 %
Eosinophil #: 0.1 10*3/uL (ref 0.0–0.7)
HCT: 21.4 % — ABNORMAL LOW (ref 35.0–47.0)
HGB: 7.2 g/dL — ABNORMAL LOW (ref 12.0–16.0)
Lymphocyte %: 38.6 %
MCH: 26.7 pg (ref 26.0–34.0)
MCHC: 33.5 g/dL (ref 32.0–36.0)
MCV: 80 fL (ref 80–100)
Monocyte #: 0.1 x10 3/mm — ABNORMAL LOW (ref 0.2–0.9)
Monocyte %: 2.7 %
Neutrophil #: 1.8 10*3/uL (ref 1.4–6.5)
Platelet: 229 10*3/uL (ref 150–440)
RBC: 2.69 10*6/uL — ABNORMAL LOW (ref 3.80–5.20)

## 2012-05-25 LAB — COMPREHENSIVE METABOLIC PANEL
BUN: 7 mg/dL (ref 7–18)
Bilirubin,Total: 0.8 mg/dL (ref 0.2–1.0)
Calcium, Total: 8.2 mg/dL — ABNORMAL LOW (ref 8.5–10.1)
Chloride: 106 mmol/L (ref 98–107)
Co2: 26 mmol/L (ref 21–32)
Creatinine: 0.72 mg/dL (ref 0.60–1.30)
EGFR (African American): >60
EGFR (Non-African Amer.): >60
Osmolality: 278 (ref 275–301)
Potassium: 3.8 mmol/L (ref 3.5–5.1)
SGPT (ALT): 16 U/L (ref 12–78)
Sodium: 141 mmol/L (ref 136–145)

## 2012-05-26 LAB — CBC WITH DIFFERENTIAL/PLATELET
Basophil %: 0.5 %
Eosinophil %: 0.7 %
HGB: 6.7 g/dL — ABNORMAL LOW (ref 12.0–16.0)
Lymphocyte #: 1.3 10*3/uL (ref 1.0–3.6)
MCV: 80 fL (ref 80–100)
Monocyte %: 2.5 %
Neutrophil %: 55.9 %
Platelet: 208 10*3/uL (ref 150–440)
RBC: 2.74 10*6/uL — ABNORMAL LOW (ref 3.80–5.20)
RDW: 16.3 % — ABNORMAL HIGH (ref 11.5–14.5)

## 2012-05-26 LAB — MAGNESIUM: Magnesium: 1.6 mg/dL — ABNORMAL LOW

## 2012-06-03 ENCOUNTER — Ambulatory Visit: Payer: Self-pay | Admitting: Oncology

## 2012-06-03 LAB — CBC CANCER CENTER
Basophil #: 0 x10 3/mm (ref 0.0–0.1)
Eosinophil #: 0 x10 3/mm (ref 0.0–0.7)
HCT: 24.4 % — ABNORMAL LOW (ref 35.0–47.0)
Lymphocyte %: 36.9 %
MCHC: 32.8 g/dL (ref 32.0–36.0)
Monocyte %: 14.1 %
Platelet: 228 x10 3/mm (ref 150–440)
RBC: 3.06 10*6/uL — ABNORMAL LOW (ref 3.80–5.20)
RDW: 16.5 % — ABNORMAL HIGH (ref 11.5–14.5)
WBC: 3.1 x10 3/mm — ABNORMAL LOW (ref 3.6–11.0)

## 2012-06-03 LAB — COMPREHENSIVE METABOLIC PANEL
Alkaline Phosphatase: 87 U/L (ref 50–136)
Anion Gap: 13 (ref 7–16)
Bilirubin,Total: 0.5 mg/dL (ref 0.2–1.0)
Calcium, Total: 8.2 mg/dL — ABNORMAL LOW (ref 8.5–10.1)
Chloride: 92 mmol/L — ABNORMAL LOW (ref 98–107)
Co2: 28 mmol/L (ref 21–32)
Creatinine: 0.84 mg/dL (ref 0.60–1.30)
EGFR (Non-African Amer.): >60
Osmolality: 264 (ref 275–301)
Potassium: 2.7 mmol/L — ABNORMAL LOW (ref 3.5–5.1)
Sodium: 133 mmol/L — ABNORMAL LOW (ref 136–145)

## 2012-06-03 LAB — MAGNESIUM: Magnesium: 0.9 mg/dL — ABNORMAL LOW

## 2012-06-09 ENCOUNTER — Inpatient Hospital Stay: Payer: Self-pay | Admitting: Oncology

## 2012-06-09 LAB — COMPREHENSIVE METABOLIC PANEL
Albumin: 3.7 g/dL (ref 3.4–5.0)
Alkaline Phosphatase: 104 U/L (ref 50–136)
Anion Gap: 14 (ref 7–16)
BUN: 9 mg/dL (ref 7–18)
Bilirubin,Total: 0.5 mg/dL (ref 0.2–1.0)
Calcium, Total: 9.3 mg/dL (ref 8.5–10.1)
Creatinine: 0.89 mg/dL (ref 0.60–1.30)
Glucose: 119 mg/dL — ABNORMAL HIGH (ref 65–99)
Osmolality: 264 (ref 275–301)
Potassium: 3 mmol/L — ABNORMAL LOW (ref 3.5–5.1)
SGOT(AST): 25 U/L (ref 15–37)
Sodium: 132 mmol/L — ABNORMAL LOW (ref 136–145)

## 2012-06-09 LAB — CBC CANCER CENTER
Basophil #: 0 x10 3/mm (ref 0.0–0.1)
Basophil %: 0.3 %
Eosinophil #: 0 x10 3/mm (ref 0.0–0.7)
Eosinophil %: 0.2 %
Lymphocyte %: 39.4 %
Monocyte %: 15.8 %
Neutrophil %: 44.3 %
Platelet: 354 x10 3/mm (ref 150–440)
RBC: 3.57 10*6/uL — ABNORMAL LOW (ref 3.80–5.20)
RDW: 16.5 % — ABNORMAL HIGH (ref 11.5–14.5)
WBC: 3.3 x10 3/mm — ABNORMAL LOW (ref 3.6–11.0)

## 2012-06-09 LAB — BASIC METABOLIC PANEL
Calcium, Total: 8.3 mg/dL — ABNORMAL LOW (ref 8.5–10.1)
Co2: 27 mmol/L (ref 21–32)
EGFR (African American): >60
EGFR (Non-African Amer.): >60
Osmolality: 269 (ref 275–301)
Potassium: 3.2 mmol/L — ABNORMAL LOW (ref 3.5–5.1)
Sodium: 135 mmol/L — ABNORMAL LOW (ref 136–145)

## 2012-06-09 LAB — MAGNESIUM: Magnesium: 2.4 mg/dL

## 2012-06-10 LAB — COMPREHENSIVE METABOLIC PANEL
Albumin: 2.8 g/dL — ABNORMAL LOW (ref 3.4–5.0)
Anion Gap: 10 (ref 7–16)
Bilirubin,Total: 0.3 mg/dL (ref 0.2–1.0)
Calcium, Total: 7.9 mg/dL — ABNORMAL LOW (ref 8.5–10.1)
Chloride: 103 mmol/L (ref 98–107)
Creatinine: 0.59 mg/dL — ABNORMAL LOW (ref 0.60–1.30)
EGFR (African American): >60
EGFR (Non-African Amer.): >60
Glucose: 126 mg/dL — ABNORMAL HIGH (ref 65–99)
Osmolality: 276 (ref 275–301)
SGOT(AST): 24 U/L (ref 15–37)
SGPT (ALT): 19 U/L (ref 12–78)
Sodium: 139 mmol/L (ref 136–145)
Total Protein: 6.5 g/dL (ref 6.4–8.2)

## 2012-06-10 LAB — CBC WITH DIFFERENTIAL/PLATELET
Eosinophil %: 0.2 %
HCT: 21.8 % — ABNORMAL LOW (ref 35.0–47.0)
Lymphocyte %: 45.8 %
MCHC: 35 g/dL (ref 32.0–36.0)
Monocyte %: 18.6 %
Neutrophil %: 35.1 %
Platelet: 291 10*3/uL (ref 150–440)
RBC: 2.77 10*6/uL — ABNORMAL LOW (ref 3.80–5.20)
WBC: 3.2 10*3/uL — ABNORMAL LOW (ref 3.6–11.0)

## 2012-06-10 LAB — MAGNESIUM: Magnesium: 1.5 mg/dL — ABNORMAL LOW

## 2012-06-11 ENCOUNTER — Ambulatory Visit: Payer: Self-pay | Admitting: Oncology

## 2012-06-11 LAB — CBC WITH DIFFERENTIAL/PLATELET
Basophil #: 0 10*3/uL (ref 0.0–0.1)
Eosinophil %: 0.7 %
HCT: 21.8 % — ABNORMAL LOW (ref 35.0–47.0)
Lymphocyte #: 1.6 10*3/uL (ref 1.0–3.6)
MCH: 27.3 pg (ref 26.0–34.0)
MCHC: 34.1 g/dL (ref 32.0–36.0)
MCV: 80 fL (ref 80–100)
Monocyte #: 0.6 x10 3/mm (ref 0.2–0.9)
Monocyte %: 16.7 %
Neutrophil #: 1.1 10*3/uL — ABNORMAL LOW (ref 1.4–6.5)
Neutrophil %: 34.2 %
RBC: 2.72 10*6/uL — ABNORMAL LOW (ref 3.80–5.20)
RDW: 17 % — ABNORMAL HIGH (ref 11.5–14.5)
WBC: 3.3 10*3/uL — ABNORMAL LOW (ref 3.6–11.0)

## 2012-06-11 LAB — BASIC METABOLIC PANEL
Anion Gap: 6 — ABNORMAL LOW (ref 7–16)
BUN: 2 mg/dL — ABNORMAL LOW (ref 7–18)
Calcium, Total: 8.4 mg/dL — ABNORMAL LOW (ref 8.5–10.1)
Chloride: 107 mmol/L (ref 98–107)
Co2: 26 mmol/L (ref 21–32)
Creatinine: 0.67 mg/dL (ref 0.60–1.30)
EGFR (African American): >60
Osmolality: 274 (ref 275–301)

## 2012-06-12 LAB — CBC WITH DIFFERENTIAL/PLATELET
Basophil #: 0 10*3/uL (ref 0.0–0.1)
Basophil %: 0.4 %
Eosinophil #: 0 10*3/uL (ref 0.0–0.7)
HGB: 7.7 g/dL — ABNORMAL LOW (ref 12.0–16.0)
Lymphocyte #: 1.5 10*3/uL (ref 1.0–3.6)
MCH: 26.2 pg (ref 26.0–34.0)
MCV: 79 fL — ABNORMAL LOW (ref 80–100)
Monocyte #: 0.5 x10 3/mm (ref 0.2–0.9)
Monocyte %: 15 %
Neutrophil #: 1.5 10*3/uL (ref 1.4–6.5)
Neutrophil %: 41.3 %
Platelet: 282 10*3/uL (ref 150–440)
RBC: 2.95 10*6/uL — ABNORMAL LOW (ref 3.80–5.20)
RDW: 17.1 % — ABNORMAL HIGH (ref 11.5–14.5)

## 2012-06-12 LAB — BASIC METABOLIC PANEL
Calcium, Total: 8.6 mg/dL (ref 8.5–10.1)
Chloride: 107 mmol/L (ref 98–107)
Co2: 23 mmol/L (ref 21–32)
Creatinine: 0.73 mg/dL (ref 0.60–1.30)
EGFR (African American): >60
EGFR (Non-African Amer.): >60
Glucose: 99 mg/dL (ref 65–99)
Osmolality: 274 (ref 275–301)
Potassium: 4.2 mmol/L (ref 3.5–5.1)

## 2012-06-12 LAB — MAGNESIUM: Magnesium: 1.4 mg/dL — ABNORMAL LOW

## 2012-06-14 LAB — COMPREHENSIVE METABOLIC PANEL
Albumin: 2.5 g/dL — ABNORMAL LOW (ref 3.4–5.0)
Anion Gap: 9 (ref 7–16)
BUN: 2 mg/dL — ABNORMAL LOW (ref 7–18)
Calcium, Total: 8.4 mg/dL — ABNORMAL LOW (ref 8.5–10.1)
Chloride: 103 mmol/L (ref 98–107)
EGFR (African American): >60
EGFR (Non-African Amer.): >60
Glucose: 119 mg/dL — ABNORMAL HIGH (ref 65–99)
Potassium: 3.8 mmol/L (ref 3.5–5.1)
SGOT(AST): 13 U/L — ABNORMAL LOW (ref 15–37)
SGPT (ALT): 14 U/L (ref 12–78)

## 2012-06-14 LAB — CBC WITH DIFFERENTIAL/PLATELET
Basophil #: 0 10*3/uL (ref 0.0–0.1)
Basophil %: 0.6 %
HCT: 22 % — ABNORMAL LOW (ref 35.0–47.0)
HGB: 7.2 g/dL — ABNORMAL LOW (ref 12.0–16.0)
Lymphocyte %: 33.9 %
MCHC: 32.6 g/dL (ref 32.0–36.0)
Monocyte %: 15.2 %
Neutrophil #: 2 10*3/uL (ref 1.4–6.5)
Neutrophil %: 49.5 %
Platelet: 243 10*3/uL (ref 150–440)
RBC: 2.79 10*6/uL — ABNORMAL LOW (ref 3.80–5.20)
RDW: 17.7 % — ABNORMAL HIGH (ref 11.5–14.5)
WBC: 4 10*3/uL (ref 3.6–11.0)

## 2012-06-14 LAB — MAGNESIUM: Magnesium: 0.9 mg/dL — ABNORMAL LOW

## 2012-06-15 LAB — BASIC METABOLIC PANEL
BUN: 2 mg/dL — ABNORMAL LOW (ref 7–18)
Calcium, Total: 8.4 mg/dL — ABNORMAL LOW (ref 8.5–10.1)
Chloride: 107 mmol/L (ref 98–107)
Creatinine: 0.76 mg/dL (ref 0.60–1.30)
EGFR (African American): >60
EGFR (Non-African Amer.): >60
Glucose: 105 mg/dL — ABNORMAL HIGH (ref 65–99)
Osmolality: 278 (ref 275–301)
Potassium: 3.8 mmol/L (ref 3.5–5.1)

## 2012-06-15 LAB — CBC WITH DIFFERENTIAL/PLATELET
Basophil %: 0.5 %
Eosinophil %: 0.7 %
HCT: 21.8 % — ABNORMAL LOW (ref 35.0–47.0)
HGB: 7.2 g/dL — ABNORMAL LOW (ref 12.0–16.0)
Lymphocyte #: 1.3 10*3/uL (ref 1.0–3.6)
MCH: 26.1 pg (ref 26.0–34.0)
MCHC: 33.1 g/dL (ref 32.0–36.0)
MCV: 79 fL — ABNORMAL LOW (ref 80–100)
Monocyte #: 0.6 x10 3/mm (ref 0.2–0.9)
Neutrophil #: 2.3 10*3/uL (ref 1.4–6.5)
Neutrophil %: 54.2 %
RBC: 2.76 10*6/uL — ABNORMAL LOW (ref 3.80–5.20)
WBC: 4.2 10*3/uL (ref 3.6–11.0)

## 2012-06-15 LAB — MAGNESIUM: Magnesium: 1.6 mg/dL — ABNORMAL LOW

## 2012-06-16 LAB — MAGNESIUM: Magnesium: 1.6 mg/dL — ABNORMAL LOW

## 2012-06-17 LAB — MAGNESIUM: Magnesium: 1.8 mg/dL

## 2012-06-19 LAB — MAGNESIUM: Magnesium: 1.5 mg/dL — ABNORMAL LOW

## 2012-06-20 LAB — MAGNESIUM: Magnesium: 1.5 mg/dL — ABNORMAL LOW

## 2012-06-21 LAB — MAGNESIUM: Magnesium: 1.6 mg/dL — ABNORMAL LOW

## 2012-06-21 LAB — POTASSIUM: Potassium: 3.9 mmol/L (ref 3.5–5.1)

## 2012-06-22 LAB — CBC WITH DIFFERENTIAL/PLATELET
Basophil #: 0 10*3/uL (ref 0.0–0.1)
Basophil %: 0.3 %
Eosinophil #: 0 10*3/uL (ref 0.0–0.7)
HCT: 26.6 % — ABNORMAL LOW (ref 35.0–47.0)
HGB: 8.1 g/dL — ABNORMAL LOW (ref 12.0–16.0)
Lymphocyte #: 1.2 10*3/uL (ref 1.0–3.6)
Lymphocyte %: 12.1 %
MCH: 24.5 pg — ABNORMAL LOW (ref 26.0–34.0)
MCHC: 30.4 g/dL — ABNORMAL LOW (ref 32.0–36.0)
MCV: 81 fL (ref 80–100)
Monocyte #: 0.8 x10 3/mm (ref 0.2–0.9)
Monocyte %: 8.1 %
Platelet: 458 10*3/uL — ABNORMAL HIGH (ref 150–440)
RDW: 19.5 % — ABNORMAL HIGH (ref 11.5–14.5)
WBC: 9.8 10*3/uL (ref 3.6–11.0)

## 2012-06-22 LAB — COMPREHENSIVE METABOLIC PANEL
Albumin: 2.8 g/dL — ABNORMAL LOW (ref 3.4–5.0)
Alkaline Phosphatase: 72 U/L (ref 50–136)
BUN: 8 mg/dL (ref 7–18)
Bilirubin,Total: 0.3 mg/dL (ref 0.2–1.0)
Chloride: 107 mmol/L (ref 98–107)
Creatinine: 0.64 mg/dL (ref 0.60–1.30)
EGFR (African American): >60
EGFR (Non-African Amer.): >60
SGOT(AST): 24 U/L (ref 15–37)
SGPT (ALT): 20 U/L (ref 12–78)
Sodium: 140 mmol/L (ref 136–145)
Total Protein: 6.7 g/dL (ref 6.4–8.2)

## 2012-06-23 LAB — CA 125: CA 125: 186.4 U/mL — ABNORMAL HIGH (ref 0.0–34.0)

## 2012-06-24 LAB — CBC WITH DIFFERENTIAL/PLATELET
Basophil %: 0.5 %
Eosinophil #: 0 10*3/uL (ref 0.0–0.7)
Eosinophil %: 0 %
HGB: 8.7 g/dL — ABNORMAL LOW (ref 12.0–16.0)
Lymphocyte %: 16.7 %
MCH: 26.2 pg (ref 26.0–34.0)
Monocyte #: 0.8 x10 3/mm (ref 0.2–0.9)
Monocyte %: 14 %
Neutrophil #: 3.8 10*3/uL (ref 1.4–6.5)
Neutrophil %: 68.8 %
RBC: 3.31 10*6/uL — ABNORMAL LOW (ref 3.80–5.20)

## 2012-06-24 LAB — COMPREHENSIVE METABOLIC PANEL
Alkaline Phosphatase: 66 U/L (ref 50–136)
BUN: 18 mg/dL (ref 7–18)
Calcium, Total: 8.6 mg/dL (ref 8.5–10.1)
Chloride: 100 mmol/L (ref 98–107)
Co2: 25 mmol/L (ref 21–32)
Creatinine: 0.71 mg/dL (ref 0.60–1.30)
EGFR (Non-African Amer.): >60
Potassium: 3.8 mmol/L (ref 3.5–5.1)
SGOT(AST): 25 U/L (ref 15–37)
SGPT (ALT): 31 U/L (ref 12–78)
Sodium: 134 mmol/L — ABNORMAL LOW (ref 136–145)
Total Protein: 6.5 g/dL (ref 6.4–8.2)

## 2012-06-24 LAB — PHOSPHORUS: Phosphorus: 3.6 mg/dL (ref 2.5–4.9)

## 2012-06-25 LAB — POTASSIUM: Potassium: 3.1 mmol/L — ABNORMAL LOW (ref 3.5–5.1)

## 2012-06-25 LAB — PHOSPHORUS: Phosphorus: 3.1 mg/dL (ref 2.5–4.9)

## 2012-06-25 LAB — SODIUM: Sodium: 137 mmol/L (ref 136–145)

## 2012-06-25 LAB — MAGNESIUM: Magnesium: 1.6 mg/dL — ABNORMAL LOW

## 2012-06-25 LAB — CALCIUM: Calcium, Total: 8.2 mg/dL — ABNORMAL LOW (ref 8.5–10.1)

## 2012-06-26 LAB — CALCIUM: Calcium, Total: 8.4 mg/dL — ABNORMAL LOW (ref 8.5–10.1)

## 2012-06-26 LAB — PHOSPHORUS: Phosphorus: 4.1 mg/dL (ref 2.5–4.9)

## 2012-06-26 LAB — MAGNESIUM: Magnesium: 2.1 mg/dL

## 2012-06-26 LAB — POTASSIUM: Potassium: 4 mmol/L (ref 3.5–5.1)

## 2012-06-27 LAB — POTASSIUM: Potassium: 3.8 mmol/L (ref 3.5–5.1)

## 2012-06-27 LAB — MAGNESIUM: Magnesium: 1.4 mg/dL — ABNORMAL LOW

## 2012-06-27 LAB — CALCIUM: Calcium, Total: 8.4 mg/dL — ABNORMAL LOW (ref 8.5–10.1)

## 2012-06-27 LAB — PHOSPHORUS: Phosphorus: 2.2 mg/dL — ABNORMAL LOW (ref 2.5–4.9)

## 2012-06-28 LAB — CALCIUM: Calcium, Total: 8.6 mg/dL (ref 8.5–10.1)

## 2012-06-28 LAB — PHOSPHORUS: Phosphorus: 3.2 mg/dL (ref 2.5–4.9)

## 2012-06-28 LAB — POTASSIUM: Potassium: 3.7 mmol/L (ref 3.5–5.1)

## 2012-06-29 LAB — CBC WITH DIFFERENTIAL/PLATELET
Basophil #: 0 10*3/uL (ref 0.0–0.1)
Basophil %: 0.2 %
Eosinophil #: 0 10*3/uL (ref 0.0–0.7)
HCT: 23.3 % — ABNORMAL LOW (ref 35.0–47.0)
HGB: 7.6 g/dL — ABNORMAL LOW (ref 12.0–16.0)
Lymphocyte %: 20.5 %
MCH: 25.7 pg — ABNORMAL LOW (ref 26.0–34.0)
MCHC: 32.5 g/dL (ref 32.0–36.0)
MCV: 79 fL — ABNORMAL LOW (ref 80–100)
Monocyte #: 0.1 x10 3/mm — ABNORMAL LOW (ref 0.2–0.9)
RBC: 2.96 10*6/uL — ABNORMAL LOW (ref 3.80–5.20)
RDW: 19.8 % — ABNORMAL HIGH (ref 11.5–14.5)

## 2012-06-29 LAB — PHOSPHORUS: Phosphorus: 3.3 mg/dL (ref 2.5–4.9)

## 2012-06-29 LAB — POTASSIUM: Potassium: 3.6 mmol/L (ref 3.5–5.1)

## 2012-07-02 LAB — COMPREHENSIVE METABOLIC PANEL
Albumin: 3.4 g/dL (ref 3.4–5.0)
Anion Gap: 11 (ref 7–16)
BUN: 20 mg/dL — ABNORMAL HIGH (ref 7–18)
Calcium, Total: 9.1 mg/dL (ref 8.5–10.1)
Chloride: 95 mmol/L — ABNORMAL LOW (ref 98–107)
Co2: 28 mmol/L (ref 21–32)
EGFR (African American): 51 — ABNORMAL LOW
EGFR (Non-African Amer.): 44 — ABNORMAL LOW
Glucose: 99 mg/dL (ref 65–99)
Osmolality: 271 (ref 275–301)
Potassium: 3.8 mmol/L (ref 3.5–5.1)
SGOT(AST): 42 U/L — ABNORMAL HIGH (ref 15–37)
Sodium: 134 mmol/L — ABNORMAL LOW (ref 136–145)
Total Protein: 6.9 g/dL (ref 6.4–8.2)

## 2012-07-02 LAB — CBC CANCER CENTER
Basophil %: 0.4 %
Eosinophil #: 0 x10 3/mm (ref 0.0–0.7)
Eosinophil %: 0.1 %
Lymphocyte #: 2.1 x10 3/mm (ref 1.0–3.6)
Lymphocyte %: 58.8 %
MCH: 25 pg — ABNORMAL LOW (ref 26.0–34.0)
MCHC: 31.3 g/dL — ABNORMAL LOW (ref 32.0–36.0)
Monocyte #: 0.2 x10 3/mm (ref 0.2–0.9)
Neutrophil %: 34.5 %
Platelet: 93 x10 3/mm — ABNORMAL LOW (ref 150–440)
RBC: 3.17 10*6/uL — ABNORMAL LOW (ref 3.80–5.20)

## 2012-07-02 LAB — MAGNESIUM: Magnesium: 1.1 mg/dL — ABNORMAL LOW

## 2012-07-08 ENCOUNTER — Inpatient Hospital Stay: Payer: Self-pay | Admitting: Internal Medicine

## 2012-07-08 LAB — CBC WITH DIFFERENTIAL/PLATELET
Basophil %: 0.2 %
Eosinophil %: 0 %
HCT: 30.7 % — ABNORMAL LOW (ref 35.0–47.0)
HGB: 10.2 g/dL — ABNORMAL LOW (ref 12.0–16.0)
Lymphocyte #: 0.8 10*3/uL — ABNORMAL LOW (ref 1.0–3.6)
MCH: 27 pg (ref 26.0–34.0)
MCV: 81 fL (ref 80–100)
Monocyte #: 0.2 x10 3/mm (ref 0.2–0.9)
Neutrophil #: 4.3 10*3/uL (ref 1.4–6.5)
Platelet: 141 10*3/uL — ABNORMAL LOW (ref 150–440)
RBC: 3.79 10*6/uL — ABNORMAL LOW (ref 3.80–5.20)

## 2012-07-08 LAB — COMPREHENSIVE METABOLIC PANEL
Albumin: 3.5 g/dL (ref 3.4–5.0)
Alkaline Phosphatase: 70 U/L (ref 50–136)
Anion Gap: 8 (ref 7–16)
BUN: 18 mg/dL (ref 7–18)
Bilirubin,Total: 0.6 mg/dL (ref 0.2–1.0)
Chloride: 94 mmol/L — ABNORMAL LOW (ref 98–107)
Co2: 27 mmol/L (ref 21–32)
Creatinine: 1.05 mg/dL (ref 0.60–1.30)
EGFR (African American): >60
EGFR (Non-African Amer.): 52 — ABNORMAL LOW
SGOT(AST): 43 U/L — ABNORMAL HIGH (ref 15–37)
SGPT (ALT): 114 U/L — ABNORMAL HIGH (ref 12–78)
Sodium: 129 mmol/L — ABNORMAL LOW (ref 136–145)
Total Protein: 6.9 g/dL (ref 6.4–8.2)

## 2012-07-08 LAB — URINALYSIS, COMPLETE
Nitrite: NEGATIVE
Protein: NEGATIVE
RBC,UR: 3 /HPF (ref 0–5)
WBC UR: 3 /HPF (ref 0–5)

## 2012-07-08 LAB — CK TOTAL AND CKMB (NOT AT ARMC)
CK, Total: 26 U/L (ref 21–215)
CK-MB: 1.8 ng/mL (ref 0.5–3.6)

## 2012-07-09 LAB — CBC WITH DIFFERENTIAL/PLATELET
Basophil #: 0 10*3/uL (ref 0.0–0.1)
Basophil %: 0.3 %
Eosinophil #: 0 10*3/uL (ref 0.0–0.7)
Eosinophil %: 0.3 %
HGB: 8.6 g/dL — ABNORMAL LOW (ref 12.0–16.0)
Lymphocyte #: 1.7 10*3/uL (ref 1.0–3.6)
Lymphocyte %: 31.7 %
MCV: 81 fL (ref 80–100)
Monocyte #: 0.3 x10 3/mm (ref 0.2–0.9)
Neutrophil %: 62.3 %
Platelet: 139 10*3/uL — ABNORMAL LOW (ref 150–440)
RBC: 3.25 10*6/uL — ABNORMAL LOW (ref 3.80–5.20)
RDW: 22.7 % — ABNORMAL HIGH (ref 11.5–14.5)
WBC: 5.5 10*3/uL (ref 3.6–11.0)

## 2012-07-09 LAB — BASIC METABOLIC PANEL
Chloride: 100 mmol/L (ref 98–107)
Co2: 22 mmol/L (ref 21–32)
EGFR (African American): >60
Glucose: 79 mg/dL (ref 65–99)
Sodium: 134 mmol/L — ABNORMAL LOW (ref 136–145)

## 2012-07-10 LAB — CBC WITH DIFFERENTIAL/PLATELET
Basophil %: 0.6 %
Eosinophil #: 0 10*3/uL (ref 0.0–0.7)
Eosinophil %: 0 %
HGB: 8.3 g/dL — ABNORMAL LOW (ref 12.0–16.0)
Lymphocyte #: 0.7 10*3/uL — ABNORMAL LOW (ref 1.0–3.6)
MCH: 26.9 pg (ref 26.0–34.0)
MCV: 82 fL (ref 80–100)
Monocyte #: 0.2 x10 3/mm (ref 0.2–0.9)
Neutrophil #: 4.1 10*3/uL (ref 1.4–6.5)
Platelet: 144 10*3/uL — ABNORMAL LOW (ref 150–440)
RBC: 3.08 10*6/uL — ABNORMAL LOW (ref 3.80–5.20)

## 2012-07-10 LAB — BASIC METABOLIC PANEL
Anion Gap: 11 (ref 7–16)
BUN: 17 mg/dL (ref 7–18)
Chloride: 104 mmol/L (ref 98–107)
Co2: 21 mmol/L (ref 21–32)
Creatinine: 0.63 mg/dL (ref 0.60–1.30)
Osmolality: 273 (ref 275–301)
Sodium: 136 mmol/L (ref 136–145)

## 2012-07-10 LAB — MAGNESIUM
Magnesium: 1.1 mg/dL — ABNORMAL LOW
Magnesium: 1.5 mg/dL — ABNORMAL LOW

## 2012-07-11 ENCOUNTER — Ambulatory Visit: Payer: Self-pay | Admitting: Oncology

## 2012-07-11 LAB — HEPATIC FUNCTION PANEL A (ARMC)
Albumin: 2.8 g/dL — ABNORMAL LOW (ref 3.4–5.0)
Alkaline Phosphatase: 59 U/L (ref 50–136)
Bilirubin, Direct: 0.1 mg/dL (ref 0.00–0.20)
SGOT(AST): 26 U/L (ref 15–37)
SGPT (ALT): 94 U/L — ABNORMAL HIGH (ref 12–78)

## 2012-07-11 LAB — CBC WITH DIFFERENTIAL/PLATELET
Basophil %: 0.4 %
Eosinophil %: 0 %
HCT: 25.1 % — ABNORMAL LOW (ref 35.0–47.0)
HGB: 8.1 g/dL — ABNORMAL LOW (ref 12.0–16.0)
Lymphocyte #: 0.9 10*3/uL — ABNORMAL LOW (ref 1.0–3.6)
MCH: 26.2 pg (ref 26.0–34.0)
MCHC: 32.3 g/dL (ref 32.0–36.0)
MCV: 81 fL (ref 80–100)
Monocyte #: 0.2 x10 3/mm (ref 0.2–0.9)
Monocyte %: 4.8 %
Platelet: 133 10*3/uL — ABNORMAL LOW (ref 150–440)
RBC: 3.09 10*6/uL — ABNORMAL LOW (ref 3.80–5.20)
WBC: 5 10*3/uL (ref 3.6–11.0)

## 2012-07-11 LAB — MAGNESIUM: Magnesium: 1.5 mg/dL — ABNORMAL LOW

## 2012-07-12 LAB — FERRITIN: Ferritin (ARMC): 1395 ng/mL — ABNORMAL HIGH (ref 8–388)

## 2012-07-14 LAB — CULTURE, BLOOD (SINGLE)

## 2012-08-11 ENCOUNTER — Ambulatory Visit: Payer: Self-pay | Admitting: Oncology

## 2012-08-11 DEATH — deceased

## 2013-10-31 IMAGING — PT NM PET TUM IMG LTD AREA
5 series · 25 of 25 positions shown · non-contrast
Comparison: none

REASON FOR EXAM: Adenocarcinoma of unknown primary, likely ovarian
origin. Patient presents with
COMMENTS:

PROCEDURE:     PET - PET/CT DX OVARIAN CA  - June 23, 2011  [DATE]
RESULT:
Procedure: Total body PET was performed in conjunction with a non-attenuated
CT status post left hand-injection of 11.1 mCi of F-18 labeled
fluorodeoxyglucose.

[Series 3: ct wb 3.0 b30f · axial · 3.0mm · 0.98mm/px · z∈[-1418,-550]mm · 9 of 435 slices shown]
[im 1/435  soft-tissue]
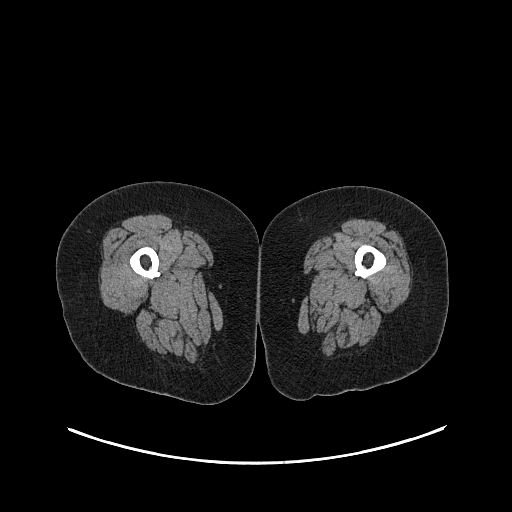
[im 55/435  soft-tissue]
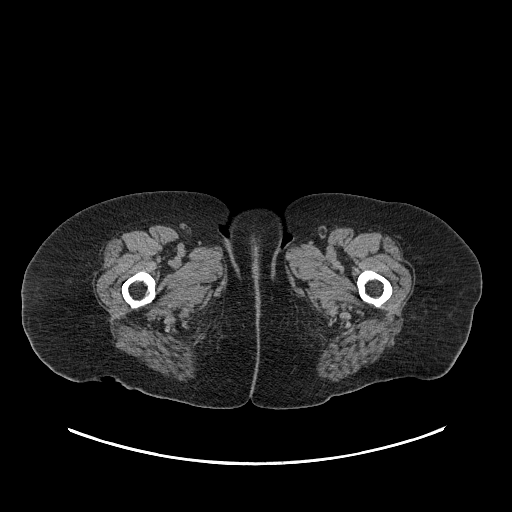
[im 109/435  soft-tissue]
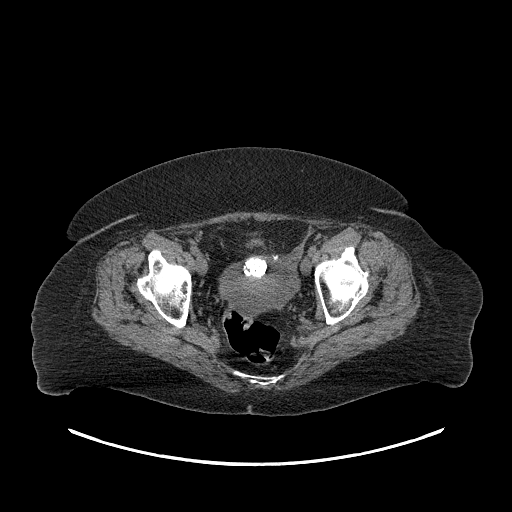
[im 163/435  soft-tissue]
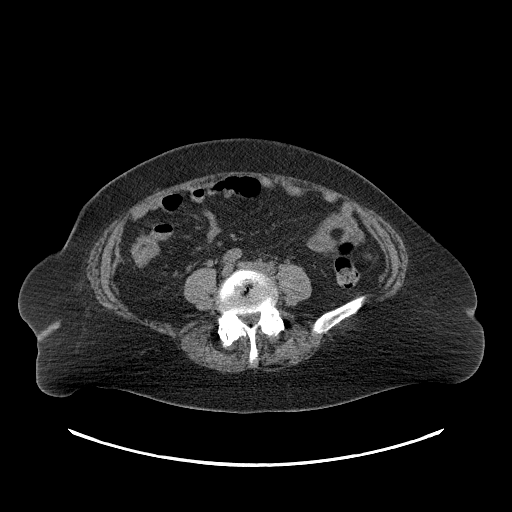
[im 218/435  soft-tissue]
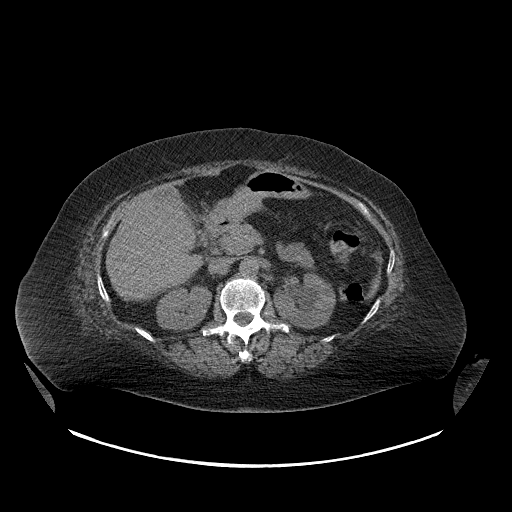
[im 272/435  soft-tissue]
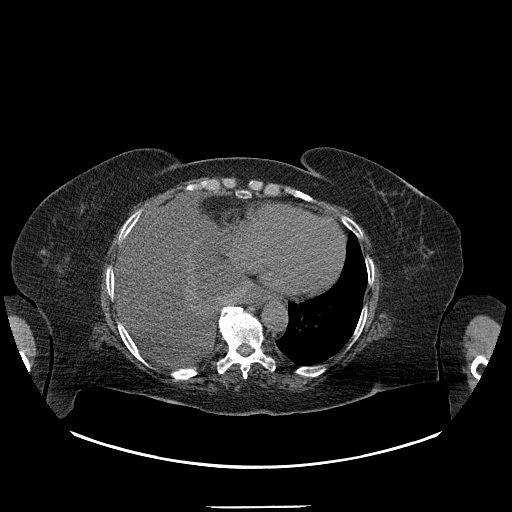
[im 326/435  soft-tissue]
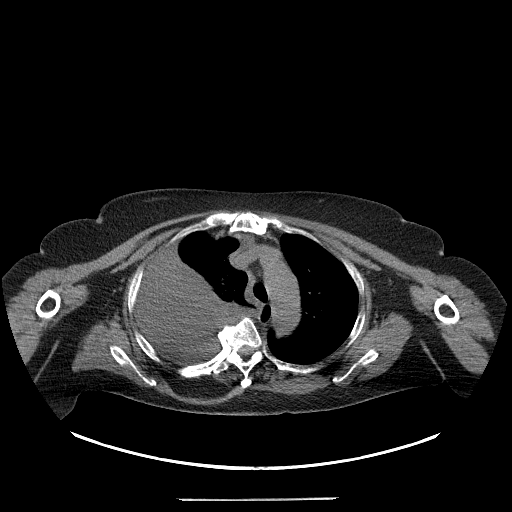
[im 380/435  soft-tissue]
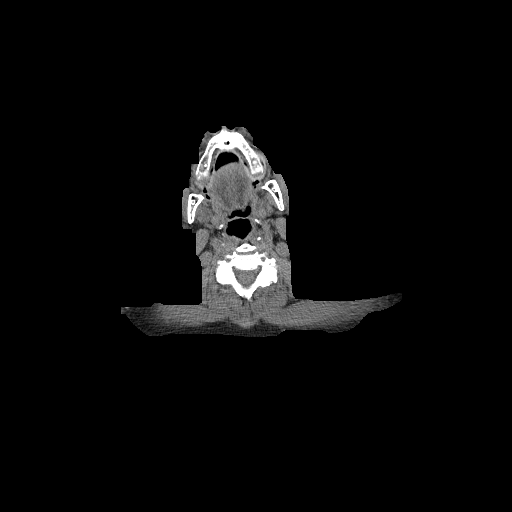
[im 435/435  soft-tissue]
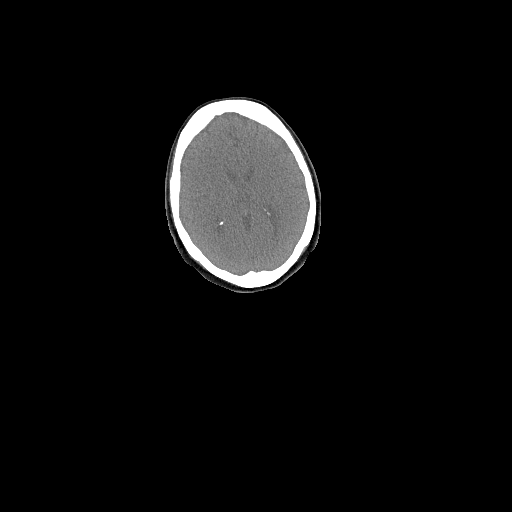

[Series 102: pet wb · axial · 5.0mm · 4.07mm/px · z∈[-1418,-550]mm · 7 of 290 slices shown]
[im 1/290]
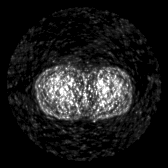
[im 49/290]
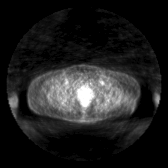
[im 97/290]
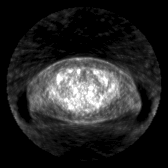
[im 145/290]
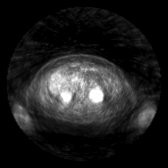
[im 193/290]
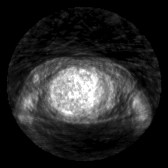
[im 241/290]
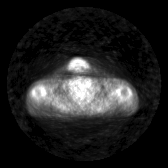
[im 290/290]
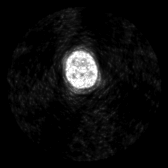

[Series 603: pet axial · 4 of 169 slices shown]
[im 1/169]
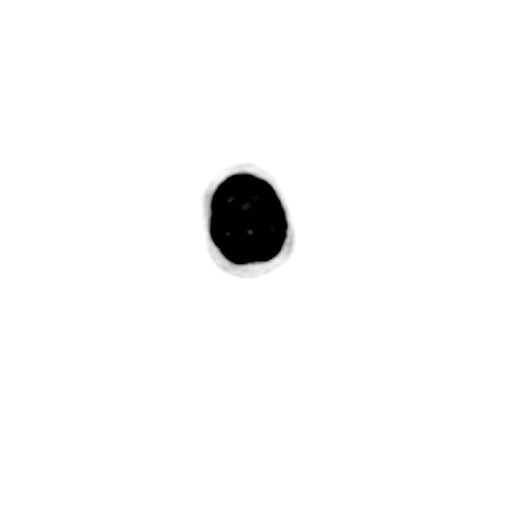
[im 57/169]
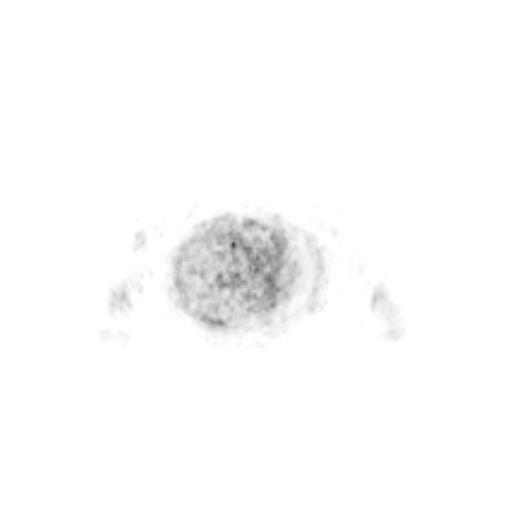
[im 113/169]
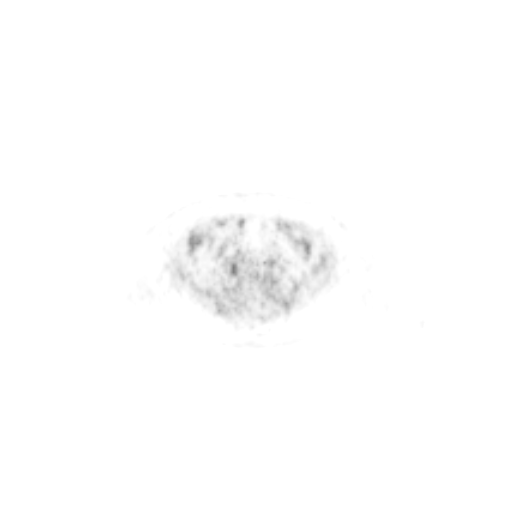
[im 169/169]
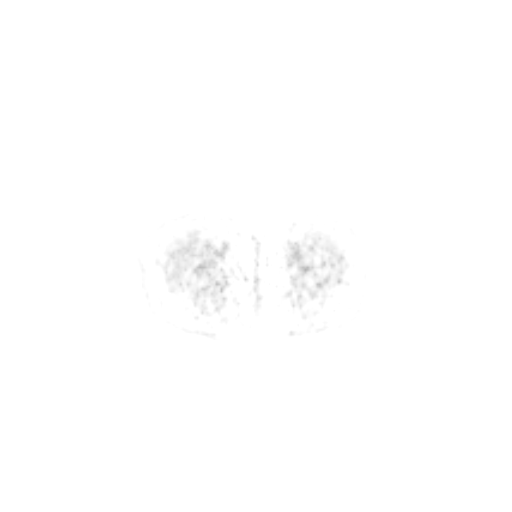

[Series 604: pet coronal · 2 of 66 slices shown]
[im 1/66]
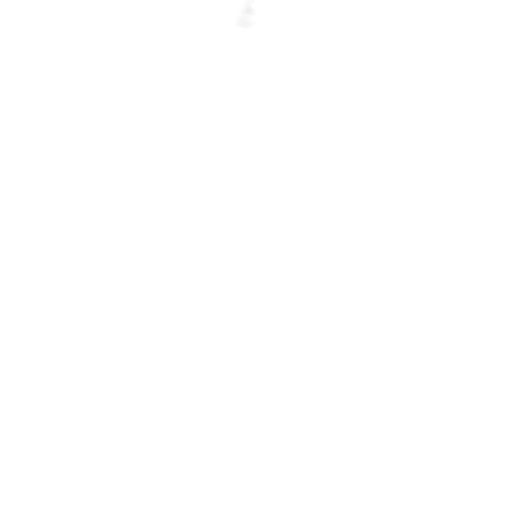
[im 66/66]
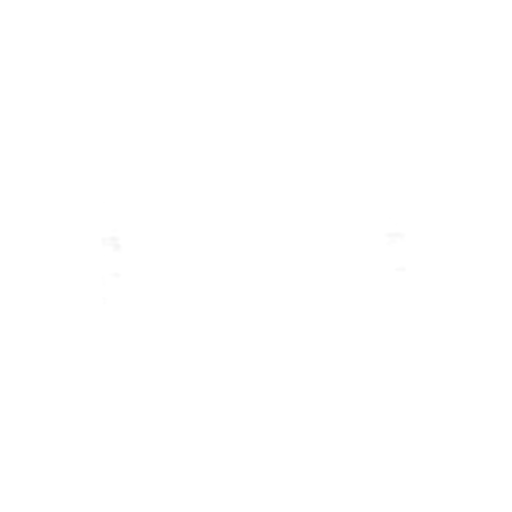

[Series 605: pet sagittal · 3 of 122 slices shown]
[im 1/122]
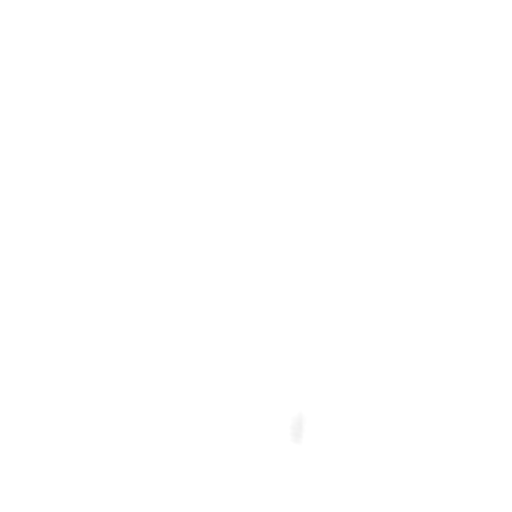
[im 61/122]
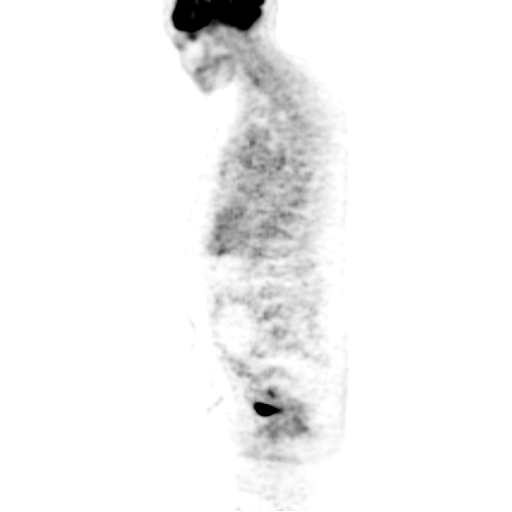
[im 122/122]
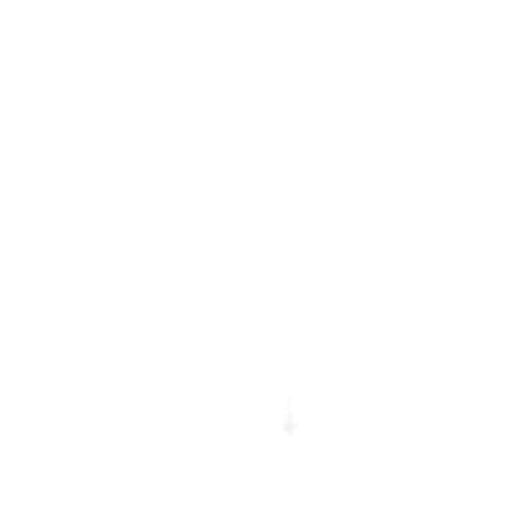

[25 of 25 positions shown; findings below may reference images not displayed]

FINDINGS: Appropriate biodistribution is identified in the region of the
brain, liver, spleen, kidneys and urinary bladder. There is evidence of
distribution throughout the bowel.

A vague area of FDG avid hypermetabolic activity projects in the posterior
base of the right lung. This is in a region of the patient's known effusion
and likely reflects reported history of a malignant effusion. The SUV is
below the limits of pathologic concern. A focal area of hypermetabolic
activity projects within the central portion of the uterus demonstrating a
mean SUV of 4.96. Within the fundus of the uterus a dystrophic calcification
is identified consistent with a degenerating fibroid. Noncontrast evaluation
of the uterus demonstrates no evidence of a collaborative mass or nodule in
the area of hypermetabolic activity. No further regions of focal abnormal
hypermetabolic activity are identified. The areas of omental change within
the abdomen do not demonstrate significant FDG avidity.
IMPRESSION: 1. Indeterminate region of FDG avidity within the uterus as described above.
If clinically warranted, further evaluation with pelvic MRI is recommended.
Note, this finding may represent the sequela of a non-degenerated fibroid. A
degenerated fibroid is appreciated within the uterus.
2. Minimal amount of hypermetabolic activity within the posterior base of
the right lung in the region of effusion likely representing the sequela of
a previously described malignant effusion.

## 2013-11-03 IMAGING — CR DG CHEST 1V PORT
1 series · 1 of 1 positions shown · non-contrast
Comparison: none

REASON FOR EXAM: Assess for Pleural Effusion
COMMENTS:

PROCEDURE:     DXR - DXR PORTABLE CHEST SINGLE VIEW  - June 26, 2011  [DATE]
RESULT:     Comparison: 06/25/2011

[view not recorded]
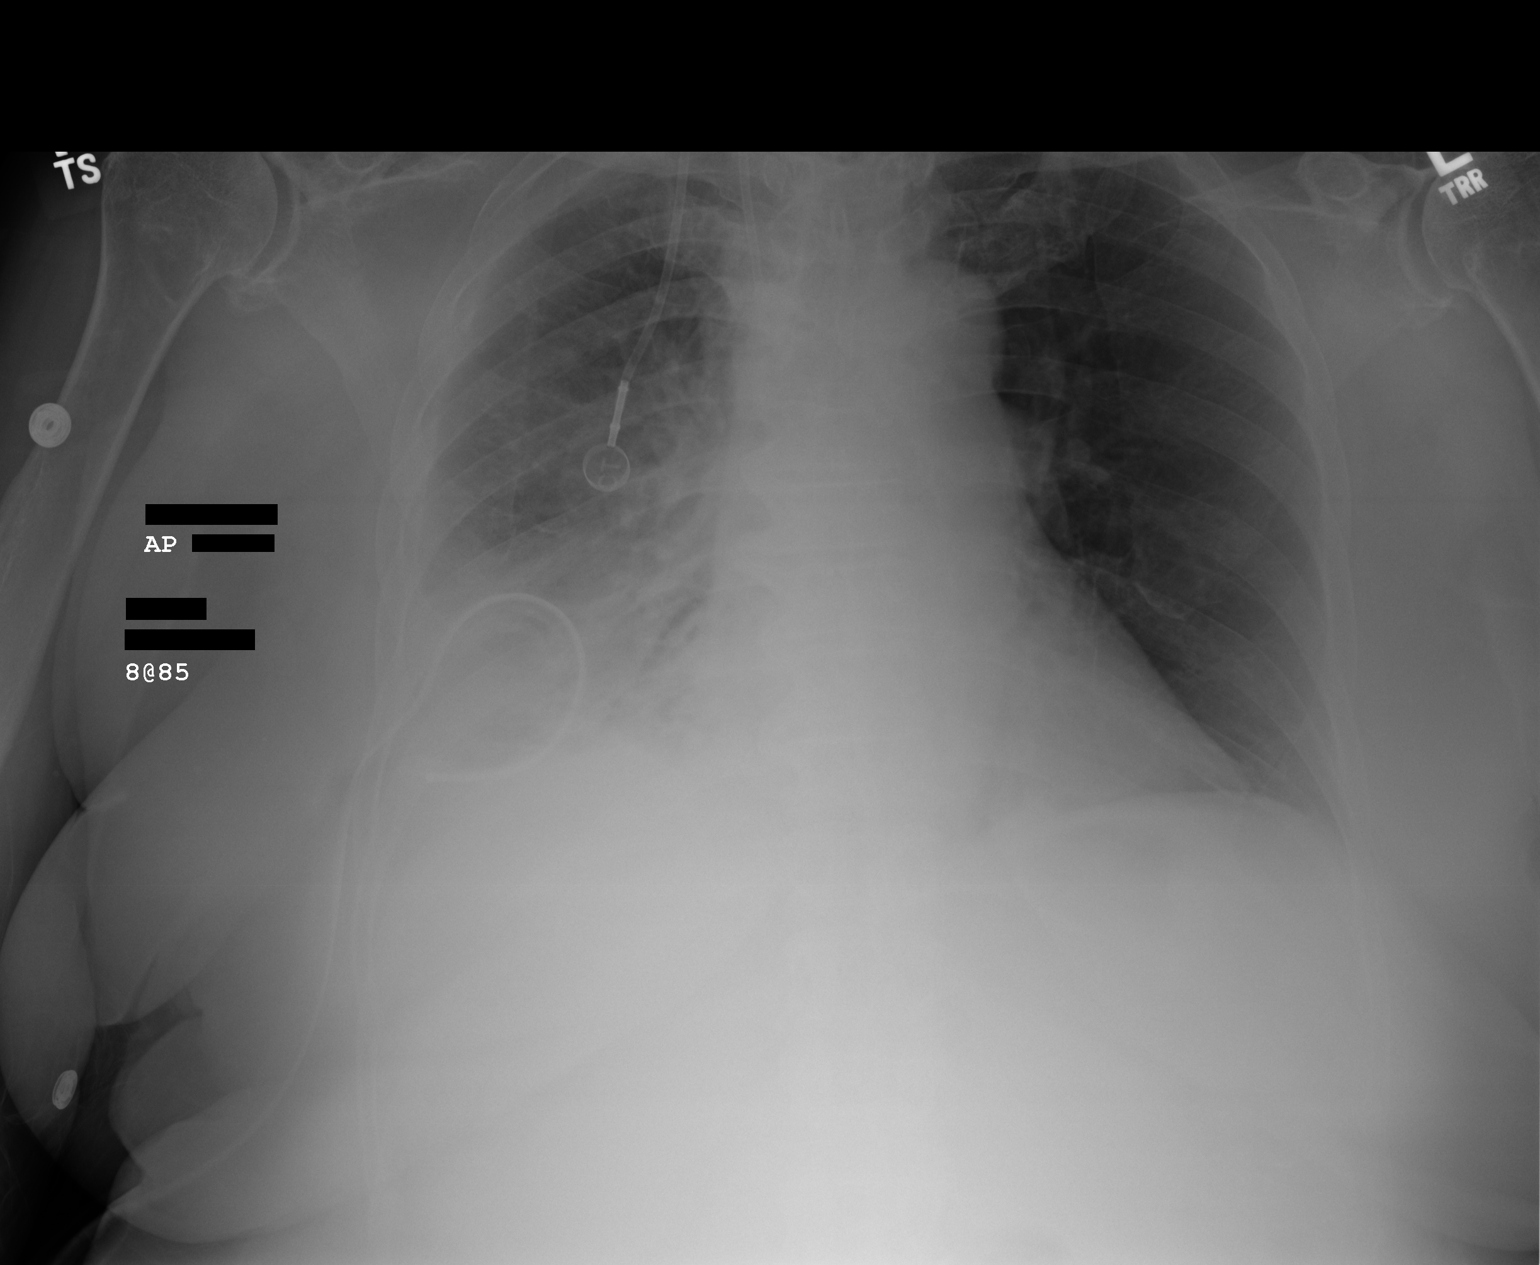

[1 of 1 positions shown; findings below may reference images not displayed]

FINDINGS: Single portable AP chest radiograph is provided. There is a small right
pleural effusion with associated airspace disease likely resenting
atelectasis. There is a right-sided chest tube at the base. This is in
unchanged position. There is a right-sided Port-A-Cath in satisfactory
position. The left lung is clear. Normal cardiomediastinal silhouette. The
osseous structures are unremarkable.
IMPRESSION: No significant interval change compared with the prior exam.

## 2013-11-05 IMAGING — CR DG CHEST 1V PORT
1 series · 1 of 1 positions shown · non-contrast
Comparison: none

REASON FOR EXAM: Assess for Pleural Effusion
COMMENTS:

[view not recorded]
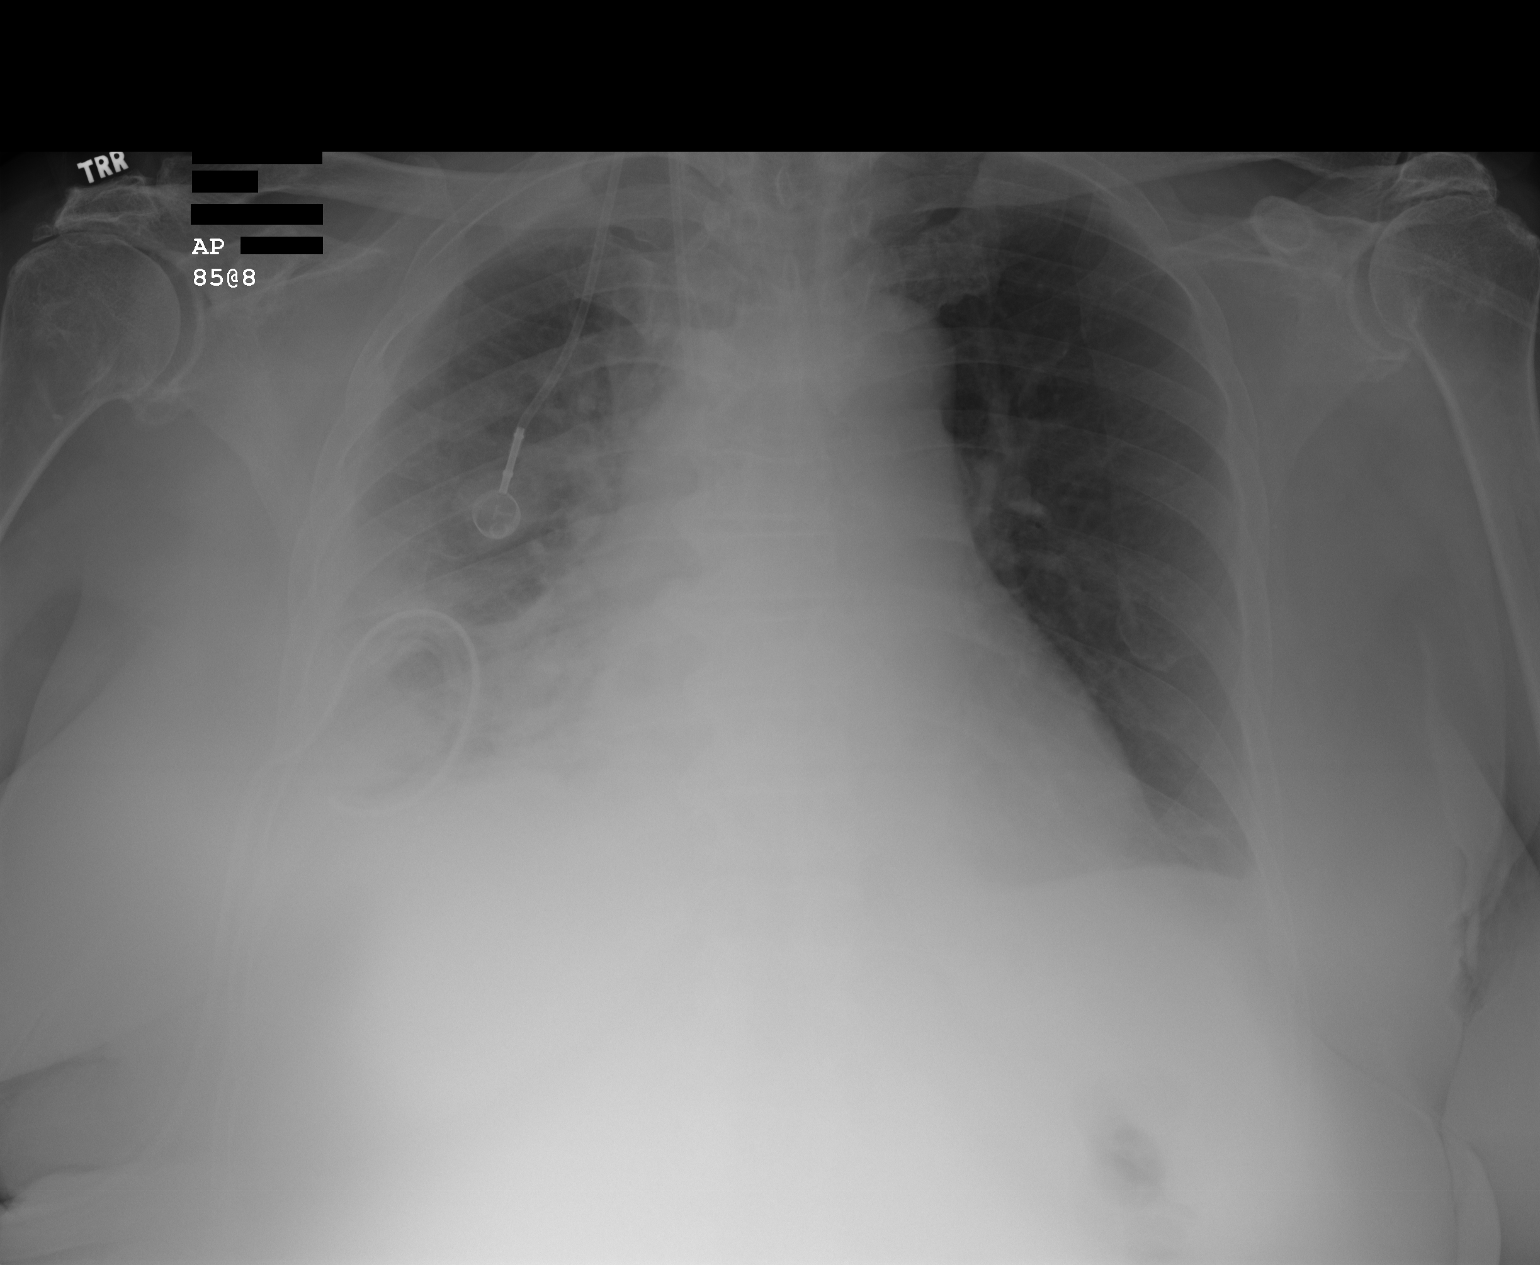

[1 of 1 positions shown; findings below may reference images not displayed]

PROCEDURE:     DXR - DXR PORTABLE CHEST SINGLE VIEW  - June 28, 2011  [DATE]

RESULT:     Comparison is made to the study 27 June, 2011.

The left lung is well-expanded and clear. On the right there remains
increased interstitial density and partial obscuration of the hemidiaphragm.
The Port-A-Cath appliance appears unchanged. The small caliber chest tube on
the right is unchanged. There is no shift of the mediastinum.
IMPRESSION: I do not see evidence of reaccumulation of a significant
pleural effusion on the right. Overall the appearance of the chest is stable
to perhaps slightly improved today.

## 2013-11-06 IMAGING — CR DG CHEST 2V
1 series · 2 of 2 positions shown · non-contrast
Comparison: none

REASON FOR EXAM: port-a-cath malfunction
COMMENTS:

[Series 1: view not recorded · 0.17mm/px · 2 of 2 slices shown]
[im 1/2]
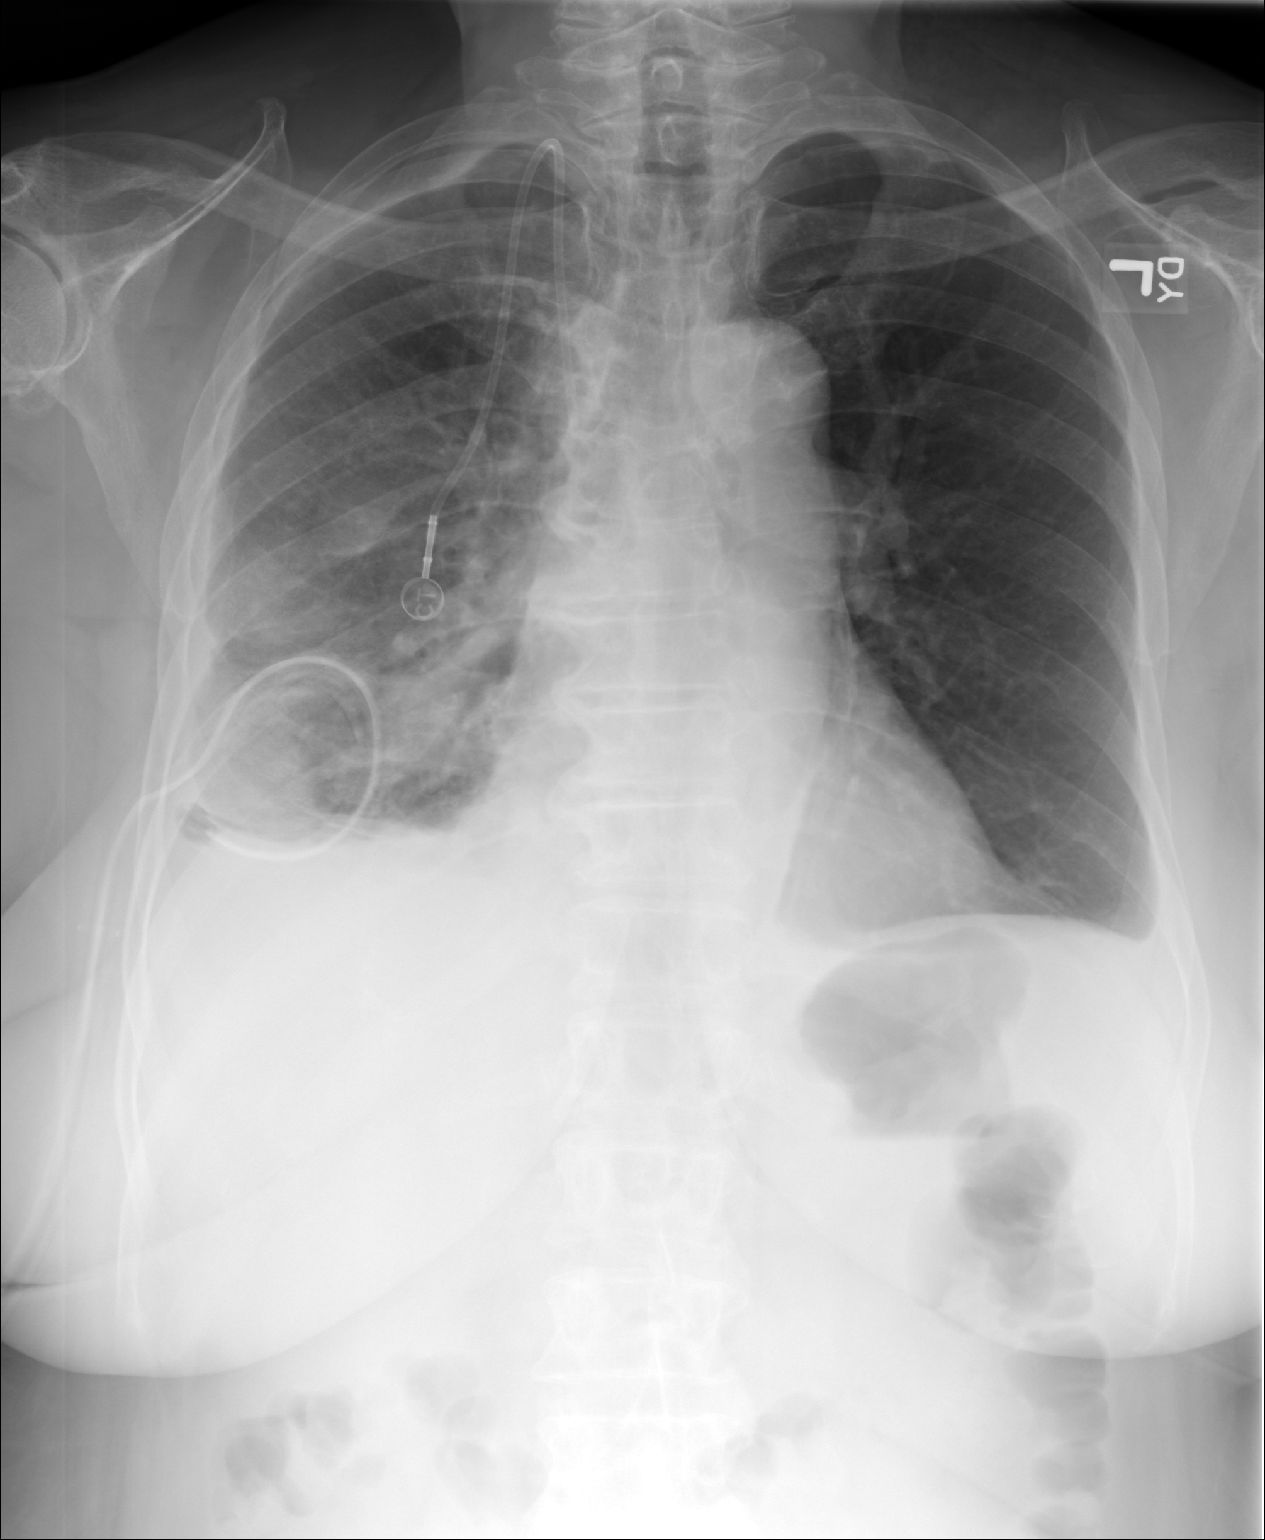
[im 2/2]
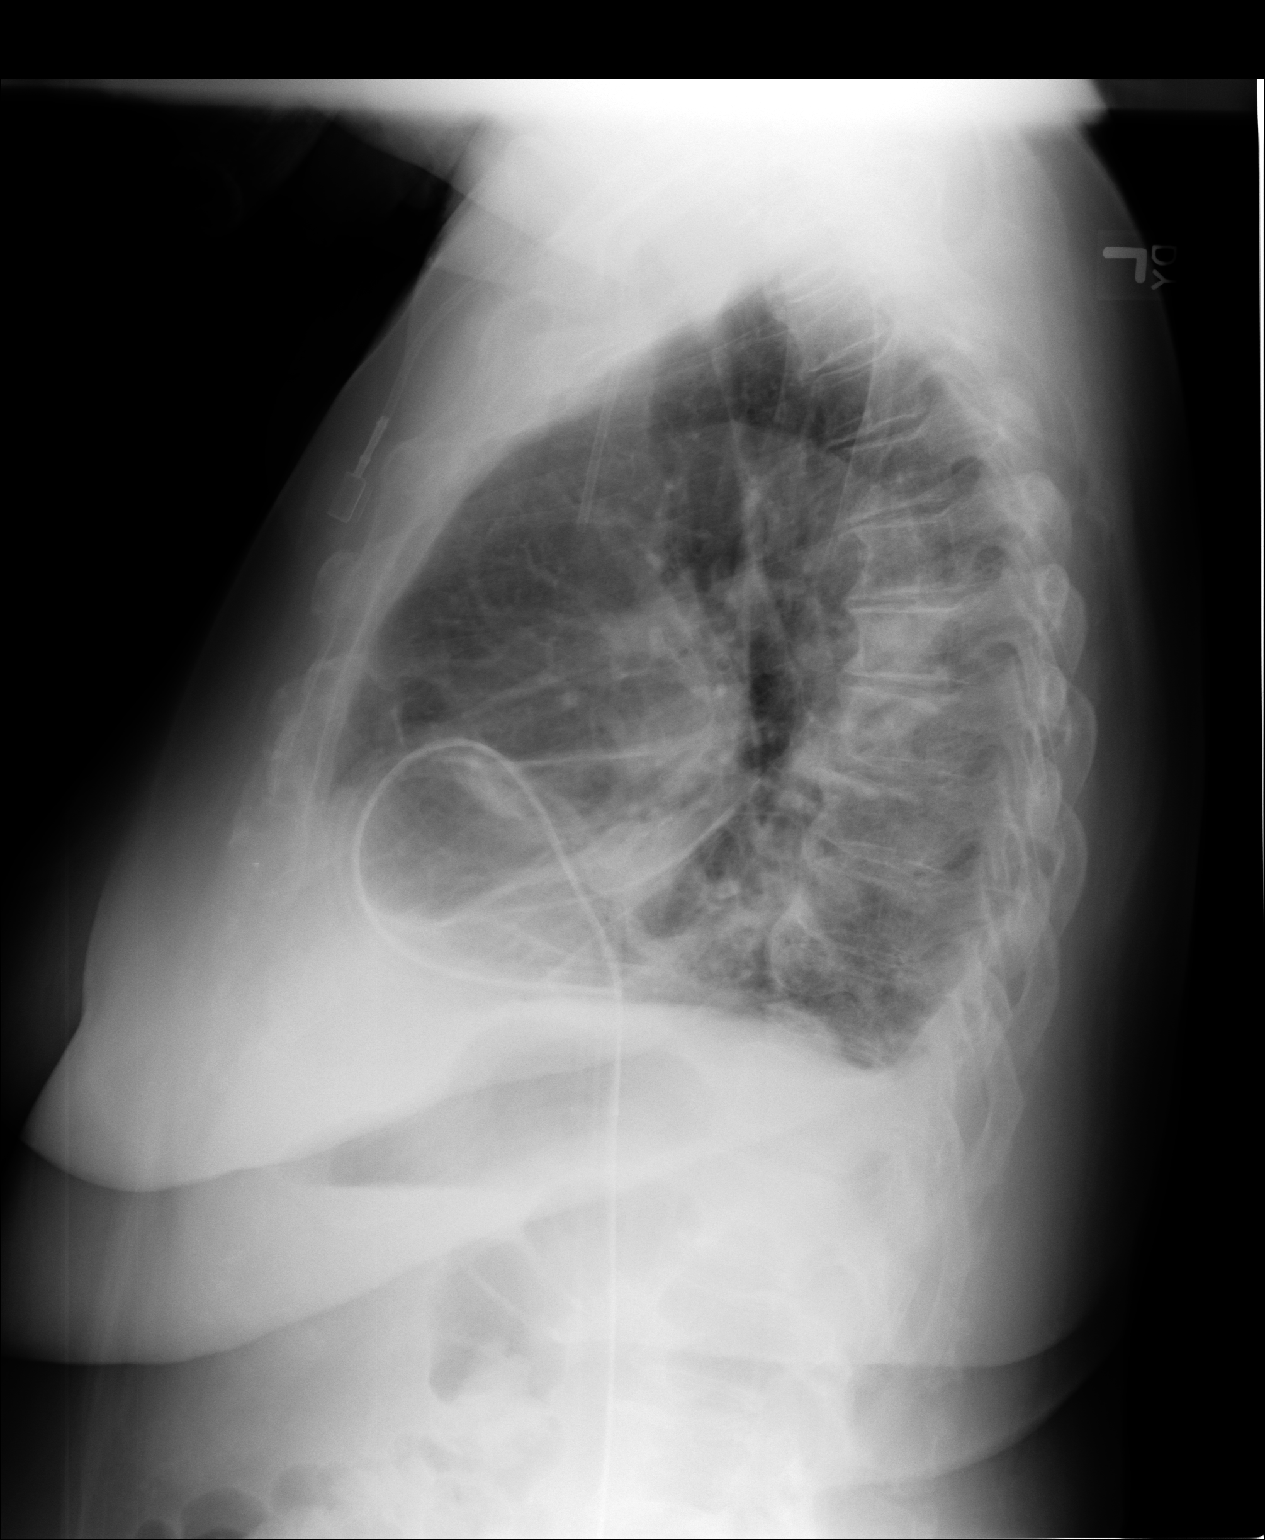

[2 of 2 positions shown; findings below may reference images not displayed]

PROCEDURE:     DXR - DXR CHEST PA (OR AP) AND LATERAL  - June 29, 2011 [DATE]

RESULT:     Comparison is made to the study June 28, 2011 at [DATE] a.m.

The left lung is well-expanded and clear. On the right the interstitial
markings remain increased but have improved since yesterday's study. The
small caliber chest tube appears unchanged. I do not see significant change
in the volume of pleural fluid present. There is no pneumothorax. The
Port-A-Cath appliance is unchanged.
IMPRESSION: 1. There has been interval improvement in the appearance of the right lung
with decreased interstitial edema. A small amount of pleural fluid likely
remains at the lung base.
2. The left lung is well-expanded and clear. Only minimal blunting of the
costophrenic gutters on the left is seen.

## 2014-09-25 IMAGING — CR DG ABDOMEN 3V
1 series · 5 of 5 positions shown · non-contrast
Comparison: none

REASON FOR EXAM: nausea and vomiting
COMMENTS:   LMP: N/A

PROCEDURE:     DXR - DXR ABDOMEN COMPLETE  - May 17, 2012 [DATE]
RESULT:     Comparison: Scalp around from abdominal CT 04/29/2010

[Series 1: erect ap · 0.17mm/px · 5 of 5 slices shown]
[im 1/5]
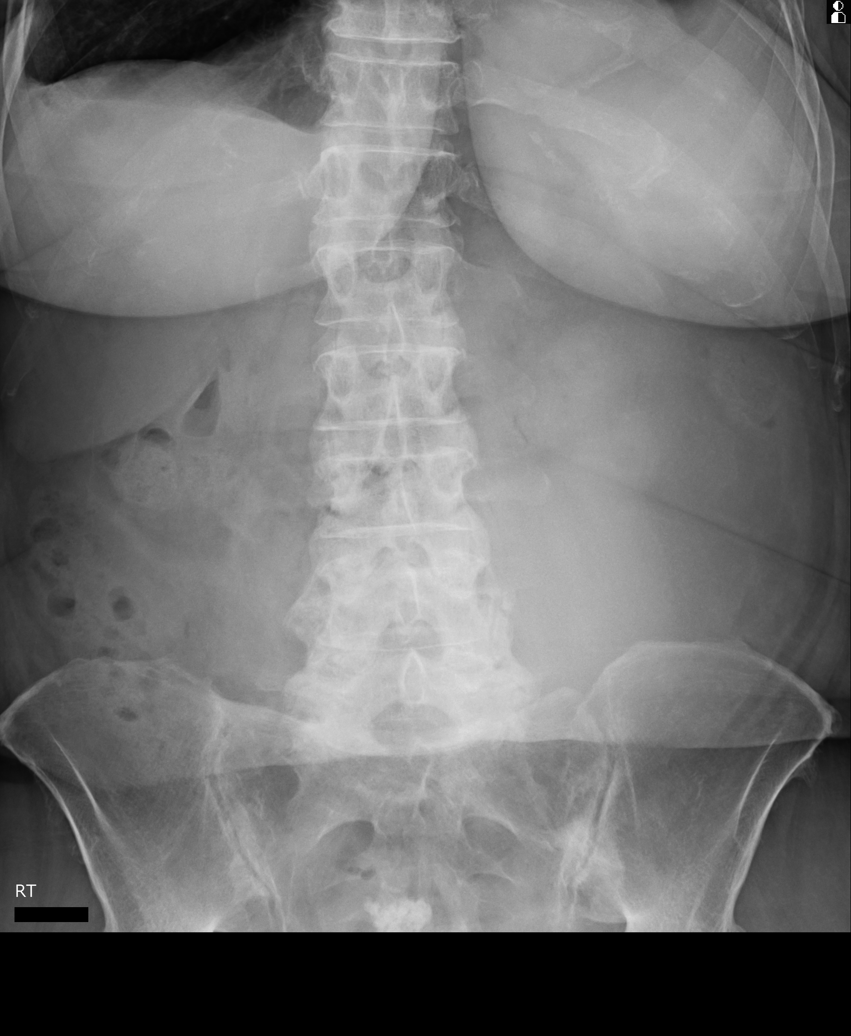
[im 2/5]
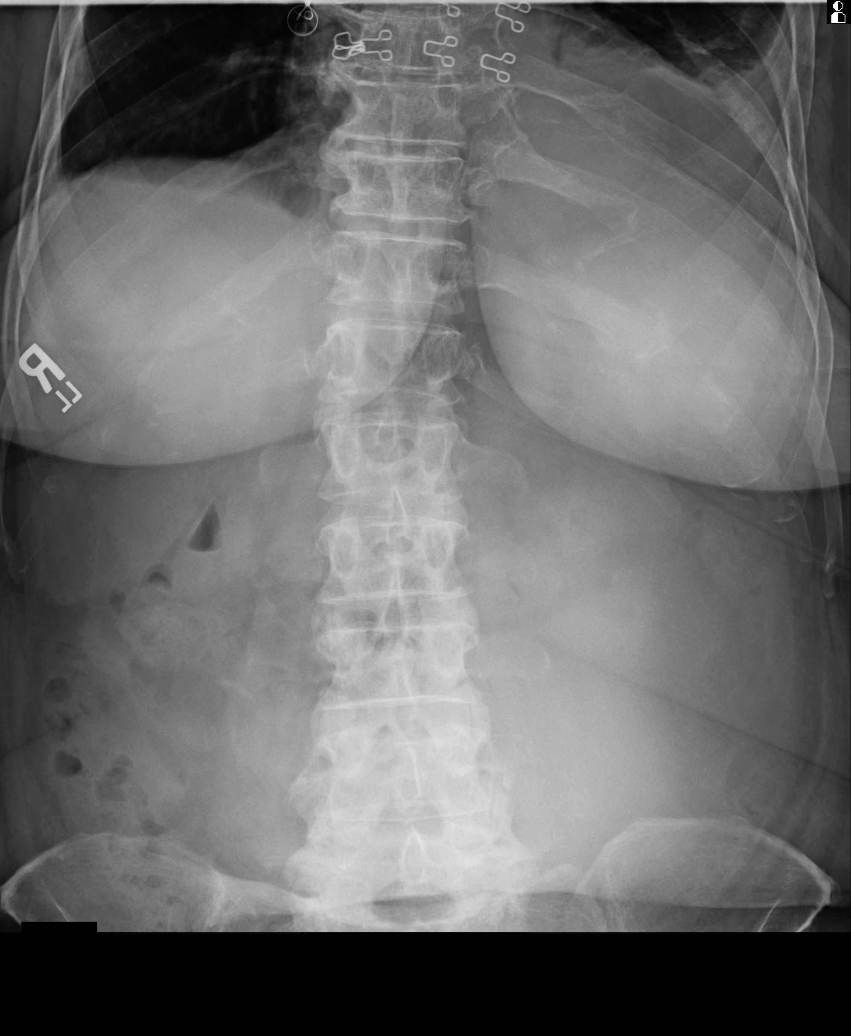
[im 3/5]
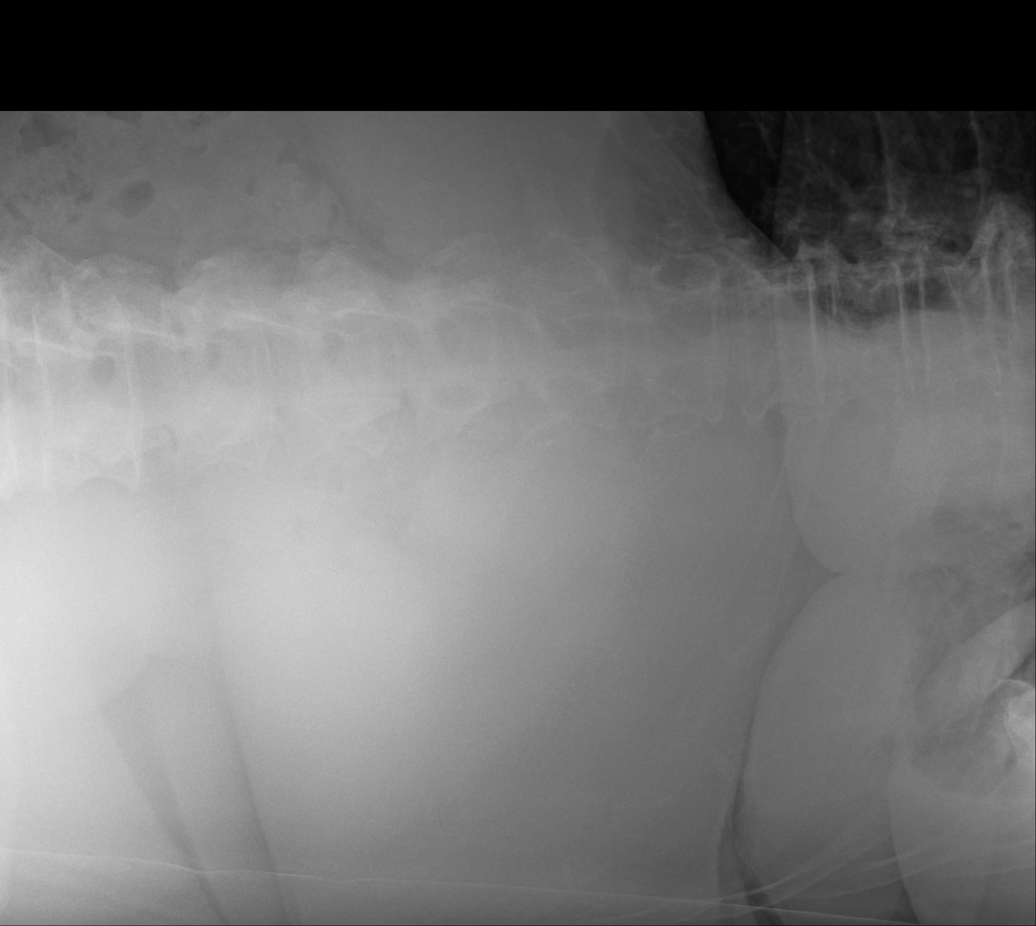
[im 4/5]
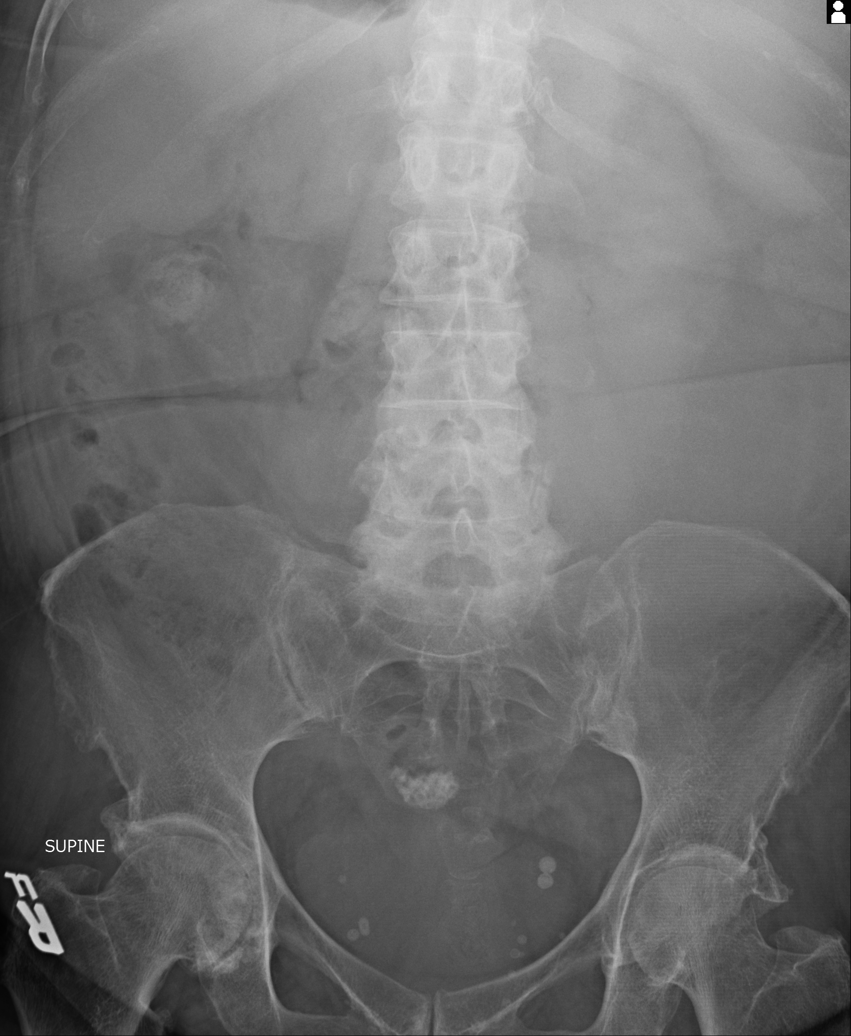
[im 5/5]
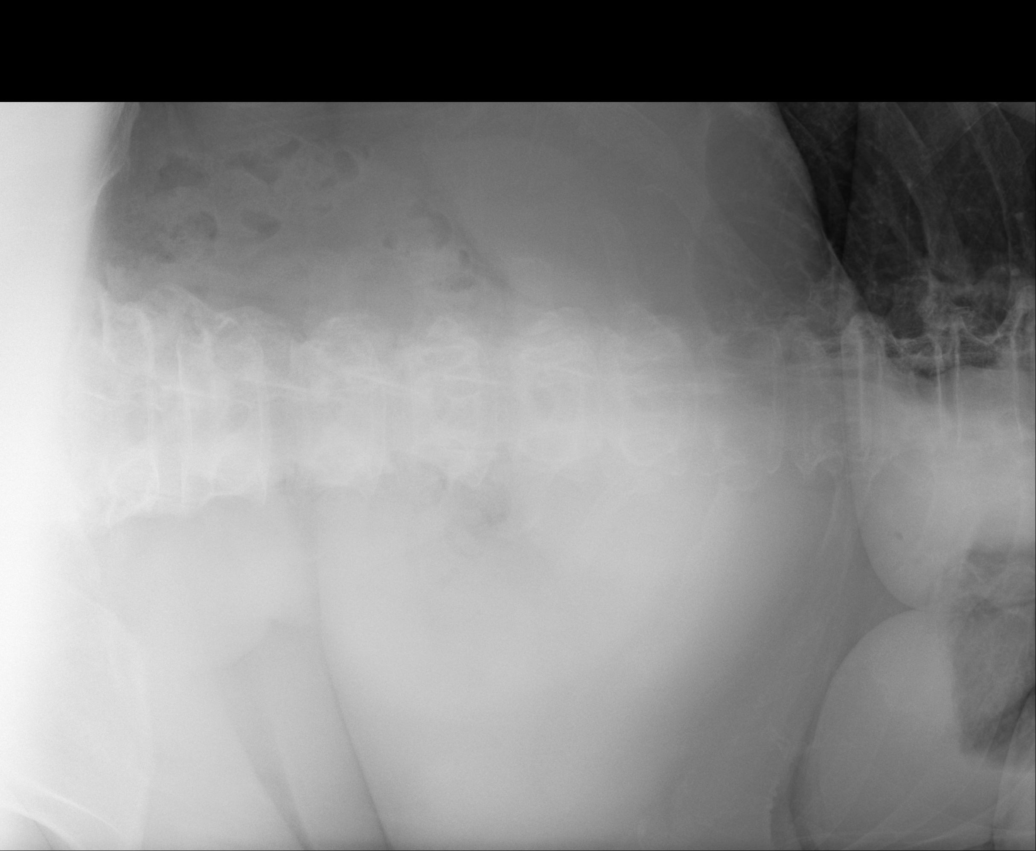

[5 of 5 positions shown; findings below may reference images not displayed]

FINDINGS: No definite free intraperitoneal air. There is a relative paucity of bowel
gas, limiting its evaluation. However, no evidence of dilated loops of
bowel. Stool is seen in the ascending colon. Calcification in the pelvis are
likely related to fibroids. There is likely a small left pleural effusion.
Left basilar opacities are nonspecific.
IMPRESSION: 1. No definite evidence of obstruction.
2. Small left pleural effusion. Left basilar opacities may be secondary to
atelectasis. Underlying a parenchymal abnormality is not excluded.

[REDACTED]

## 2014-11-28 NOTE — Consult Note (Signed)
Chief Complaint:   Subjective/Chief Complaint denies n/v today, less abdominal pain/epigastric.   VITAL SIGNS/ANCILLARY NOTES: **Vital Signs.:   04-Oct-13 13:11   Vital Signs Type Routine   Temperature Temperature (F) 98   Celsius 36.6   Temperature Source Oral   Pulse Pulse 79   Respirations Respirations 18   Systolic BP Systolic BP 112   Diastolic BP (mmHg) Diastolic BP (mmHg) 67   Mean BP 82   Pulse Ox % Pulse Ox % 94   Pulse Ox Activity Level  At rest   Oxygen Delivery Room Air/ 21 %  *Intake and Output.:   04-Oct-13 09:46   Stool  loose bloody stool x 3   Brief Assessment:   Cardiac Regular    Respiratory clear BS    Gastrointestinal details normal Soft  Nondistended  No masses palpable  Bowel sounds normal  No rebound tenderness  mild epigastric tenderness,  firmness.   Assessment/Plan:  Assessment/Plan:   Assessment 1) nausea vomitng epigastric abdominal pain. some better today.   2) stool recorded as bloody in chart, however both patietn and nursing state no bm since admission.    Plan 1) Egd today.  I have discussed the risks benefits and complications opf egd to include not limited to bleeding infection perforation and sedation and she wishes to proceed.  Further recs to follow.   Electronic Signatures: Barnetta ChapelSkulskie, Shiah Berhow (MD)  (Signed 04-Oct-13 14:15)  Authored: Chief Complaint, VITAL SIGNS/ANCILLARY NOTES, Brief Assessment, Assessment/Plan   Last Updated: 04-Oct-13 14:15 by Barnetta ChapelSkulskie, Shalayna Ornstein (MD)

## 2014-11-28 NOTE — Consult Note (Signed)
Chief Complaint:   Subjective/Chief Complaint Please see full  GI consult.  Patietn seen and examined.  Admottd with several weeks of epigstric pain and n/v.  Agree with EGD, will arrange for tomorrow pm.  Continue ppibid and antiemetics as you are.  Further recs to follow.   VITAL SIGNS/ANCILLARY NOTES: **Vital Signs.:   03-Oct-13 14:54   Vital Signs Type Routine   Temperature Temperature (F) 97.7   Celsius 36.5   Temperature Source Oral   Pulse Pulse 78   Respirations Respirations 20   Systolic BP Systolic BP 108   Diastolic BP (mmHg) Diastolic BP (mmHg) 74   Mean BP 85   Pulse Ox % Pulse Ox % 100   Pulse Ox Activity Level  At rest   Oxygen Delivery Room Air/ 21 %   Electronic Signatures: Barnetta ChapelSkulskie, Candy Leverett (MD)  (Signed 03-Oct-13 17:55)  Authored: Chief Complaint, VITAL SIGNS/ANCILLARY NOTES   Last Updated: 03-Oct-13 17:55 by Barnetta ChapelSkulskie, Yitzhak Awan (MD)

## 2014-11-28 NOTE — Consult Note (Signed)
Chief Complaint:   Subjective/Chief Complaint some nausea earlier this am, emesis during lunch.  denies abdominal pain.   VITAL SIGNS/ANCILLARY NOTES: **Vital Signs.:   07-Oct-13 13:48   Vital Signs Type Routine   Temperature Temperature (F) 98.6   Celsius 37   Temperature Source Oral   Pulse Pulse 97   Respirations Respirations 20   Systolic BP Systolic BP 614   Diastolic BP (mmHg) Diastolic BP (mmHg) 91   Mean BP 106   Pulse Ox % Pulse Ox % 96   Pulse Ox Activity Level  At rest   Oxygen Delivery Room Air/ 21 %  *Intake and Output.:   07-Oct-13 07:30   Stool  medium hard brown BM   Brief Assessment:   Cardiac Regular    Respiratory clear BS    Gastrointestinal details normal Nondistended  Bowel sounds normal  No rebound tenderness  No gaurding  moderatedly indurated upper abdomen, mild tenderness to palpation in the upper abdomen.   Lab Results: Routine Chem:  07-Oct-13 04:22    Glucose, Serum  108   BUN  2   Creatinine (comp) 0.75   Sodium, Serum 136   Potassium, Serum 3.6   Chloride, Serum 104   CO2, Serum 23   Calcium (Total), Serum 8.5   Anion Gap 9   Osmolality (calc) 269   eGFR (African American) >60   eGFR (Non-African American) >60 (eGFR values <26m/min/1.73 m2 may be an indication of chronic kidney disease (CKD). Calculated eGFR is useful in patients with stable renal function. The eGFR calculation will not be reliable in acutely ill patients when serum creatinine is changing rapidly. It is not useful in  patients on dialysis. The eGFR calculation may not be applicable to patients at the low and high extremes of body sizes, pregnant women, and vegetarians.)   Assessment/Plan:  Assessment/Plan:   Assessment 1) n.v epigastric pain, hiatal hernia on egd.  likely related to baseline changes of her cancer.    Plan 1) continue daily ppi, will add daily miralax.  will sign reconsult as needed.   Electronic Signatures: SLoistine Simas(MD)  (Signed  07-Oct-13 15:05)  Authored: Chief Complaint, VITAL SIGNS/ANCILLARY NOTES, Brief Assessment, Lab Results, Assessment/Plan   Last Updated: 07-Oct-13 15:05 by SLoistine Simas(MD)

## 2014-11-28 NOTE — Consult Note (Signed)
Brief Consult Note: Diagnosis: Intractable NV.   Patient was seen by consultant.   Consult note dictated.   Comments: Appreciate consult for 79 y/o [place] woman with history of stage IV ovarian cancer, H pylori gastritis, HL, htn, migraines, and GERD for evaluation of N/V. Noted patient's history of ovarian cancer diagnosis last year, omentectomy, pleural effusions, and chemotherapy with carboplatinum and Taxol- states she finished this 7/13, and scans have not shown any further cancer. Ca 125 was normal in July, but elevated last month. Did undergo CT scan with IV but not oral contrast 9/13 with small nonspecific ascites and a questionable small bowel abnormality, but report stated it was difficulty to evaluate for mesenteric implants without oral contrast. Patient states onset of nagging upper abdominal pain about a month ago. States it is an 8/10 if she is moving around, but gets better when she is still. States onset of nausea about 2w ago while being treated for UTI with Levaquin, but has not improved since the course finished.  Food makes nausea worse, but not pain. Has been vomiting food stuffs once/twice daily.  Denies NSAIDs. Feels like her upper abdomen is bloated and has burping. Takes Omeprazole 40mg  po daily at home. Was found to have H pylori on EGD last year- unsure if she was treated or not and chart review is inconclusive. Denies black/bloody/slimy stools, coffee ground emesis. Denies lower GI complaints. Last bm 2d ago. Colonoscopy last year revealed one adenomatous polyp.  Impression and plan: Epigastric pain and nausea. Patient with history of metastatic ovarian ca. Elevated ca 125. Do appreciate upper abdominal fullness and somewhat irregular surface on exam- this may be related to her omentectomy last year. Has had recent CT- defer repeat scanning to Dr Orlie DakinFinnegan, she has had several CTs over the last year.  Minimal tenderness at present, NV improved after medications given on  arrival to floor. Do recommend stool for H pylori testing, EGD when clinically feasible, and bid PPI. Of note, the patient is hyponatremic and may need her hctz adjusted.  Electronic Signatures: Vevelyn PatLondon, Silvino Selman H (NP)  (Signed 03-Oct-13 17:25)  Authored: Brief Consult Note   Last Updated: 03-Oct-13 17:25 by Keturah BarreLondon, Ajit Errico H (NP)

## 2014-11-28 NOTE — Consult Note (Signed)
Comments   I met with pt, her 2 daughters and 2 of her 3 sons. Pt and family realize that pt is no longer a candidate for chemotherapy and that she is declining. Discussed options at this point which are home with hospice vs transfer to the [facility]. Pt admits that she would like to go back home if possible. Family again are agreeable to being with her 24/7. We also discussed the possibility of transfer to the [facility] from pt's home if symptoms are unmanageable. Pt and family in agreement with plan. CM to offer agencies.     Electronic Signatures: Dailee Manalang, Izora Gala (MD)  (Signed 03-Dec-13 10:24)  Authored: Palliative Care   Last Updated: 03-Dec-13 10:24 by Chivon Lepage, Izora Gala (MD)

## 2014-11-28 NOTE — H&P (Signed)
PATIENT NAME:  Jane [name], Jane [name] MR#:  [id number] DATE OF BIRTH:  [birth year]  DATE OF ADMISSION:  06/09/2012  HISTORY OF PRESENT ILLNESS: Jane [name] is a patient known to me, primarily followed by Jane [name]. 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 Significant history is that recently the patient was hospitalized and received topotecan five day course October 12th through the 16th. She then transitioned to weekly treatment. Dose of topotecan was given on October 24th. The patient has additional past history of fibrocystic breast disease, hyperlipidemia, hypertension, diabetes, migraine headache, reflux, and has had thyroid surgery in the past. On this background the patient called and was seen in the office and then admitted to the floor because of nausea, vomiting, weakness, and dehydration. The patient had vomited after trying to drink or eat yesterday and could not take pills this morning, vomited them up. Had a bowel movement yesterday but since then no bowel movement or flatus. No abdominal pain. No fever. No headache. No chills or sweats. No cough. No wheezing. No hemoptysis. No rash or bruising. No dysuria or hematuria. No edema.   SOCIAL HISTORY: Unremarkable. No alcohol or tobacco.   FAMILY HISTORY: Has been previously reviewed. Not contributory to current situation. Has a sister with colon cancer, a sister with breast cancer, and a brother with lung cancer.   ALLERGIES: No known allergies.   HOME MEDICATIONS:  1. Protonix 40 mg p.o. twice a day. 2. Reglan 10 mg p.o. t.i.d.  3. Zofran 8 mg p.o. t.i.d. p.r.n.  4. Oxycodone 5 mg every four hours p.r.n. for pain.  5. Potassium 20 mEq p.o. daily. 6. Magnesium 400 mg p.o. twice a day. 7. Hydrochlorothiazide 25/lisinopril 20 daily.  8. Simvastatin 40 mg daily.  9. Multivitamin daily.  10. Calcium and Vitamin D b.i.d.    PHYSICAL EXAMINATION:   GENERAL: Alert, cooperative, looks  subacutely ill, was not in acute distress. There was no jaundice. There was no wheezing or rales.   HEART: Regular.   ABDOMEN: Soft, nontender, not distended, not tympanitic. No palpable mass or organomegaly. There was no edema.   NEUROLOGIC: Grossly nonfocal. Cranial nerves are intact. Moving all extremities. I did not test her gait.   SKIN: There are no rashes or bruising.   LYMPH: Lymph nodes are nonpalpable in the neck, supraclavicular, submandibular.   LABORATORY, DIAGNOSTIC, AND RADIOLOGICAL DATA: White blood count 11.5, hemoglobin 9.1, platelets 365, sodium 132, potassium 3.0. Magnesium was 0.9.   IMPRESSION:  1. The patient has nausea and vomiting but abdomen is soft and clinically benign and there's no fever. There are no bowel sounds. Has not had flatus or bowel movement for a day. Likely has ileus.  2. Underlying ovarian cancer status post recent chemotherapy. There is mild anemia. Will probably unmask more anemia with hydration. There is mild neutropenia. This may progress. Platelets look good.  3. Sodium, potassium, and magnesium depletion. Needs replacement.  4. History of hypertension, currently okay.  5. Patient unable to take p.o. medications. None are critical.   PLAN:  1. The patient is admitted.  2. Start IV initially with D5 normal saline with potassium and magnesium at a high volume and then change to D5 normal saline with 20 of potassium per liter at 125 mL an hour. In the evening after the first liter of fluid, electrolytes will be repeated and the Met panel and magnesium. Also repeat labs including a CBC in the morning.  3. IV Zofran q.8 q.6 hours p.r.n. for nausea.  4. Hold p.o. medicines. 5. Make n.p.o.  6. Protonix IV q.12 hours.  7. Reassess oral medications tomorrow.  8. No x-rays initially.  9. If bowel sounds do not return or if the patient's nausea is better in the morning, could start with KUB, possibly might later need a CT scan.  10. Will discuss  with Jane [name] and defer to him reviewing the treatment planning and schedule and to review prior office notes if family history and family genetic counseling is to be pursued due to ovarian cancer and other positive family history.   ____________________________ Jane Come Jane Pilgrim, MD rgg:drc D: 06/09/2012 18:44:22 ET T: 06/10/2012 05:56:58 ET JOB#: [id number]  cc: Jane Come. Jane Pilgrim, MD, <Dictator> Jane Schimke MD ELECTRONICALLY SIGNED 06/17/2012 11:51

## 2014-11-28 NOTE — Consult Note (Signed)
Comments   I met with pt and her family. Updated family on pt's current medical condition. They understand that pt may not be able to get more chemo if she does not improve clinically. I asked them about their willingness to learn to give pt IV meds at home and they are all agreeable to this. I have discussed with hospice and they recommend starting with home health with possible transition to hospice if needed. CM aware and will request home health RN, PT, OT, and CNA.    Electronic Signatures: Gamaliel Charney, Izora Gala (MD)  (Signed [phone] 15:05)  Authored: Palliative Care   Last Updated: 08-Nov-13 15:05 by Kellan Boehlke, Izora Gala (MD)

## 2014-11-28 NOTE — Consult Note (Signed)
Pt feels constipated, had one turd last night after suppository, requests stool softener.  No vomiting, no change in abd pain. She can get Dulcolax prn. Plans per oncology.  Electronic Signatures: Scot JunElliott, Leith Szafranski T (MD)  (Signed on 06-Oct-13 12:37)  Authored  Last Updated: 06-Oct-13 12:37 by Scot JunElliott, Deazia Lampi T (MD)

## 2014-11-28 NOTE — Op Note (Signed)
PATIENT NAME:  Jane [name], Reginna O MR#:  [id number] DATE OF BIRTH:  July 28, 1936  DATE OF PROCEDURE:  07/10/2012  PREOPERATIVE DIAGNOSES:  1. Deep venous thrombosis and PE.  2. Anemia.  3. Poor candidate for long-term anticoagulation.  4. Recurrent ovarian cell cancer.   POSTOPERATIVE DIAGNOSES:  1. Deep venous thrombosis and PE.  2. Anemia.  3. Poor candidate for long-term anticoagulation.  4. Recurrent ovarian cell cancer.   PROCEDURES:  1. Ultrasound guidance for vascular access, right femoral vein.  2. Catheter placement into inferior vena cava.  3. Inferior venacavogram.  4. Placement of a [employer] IVC filter.   SURGEON: Annice NeedyJason S. Dew, MD   ANESTHESIA: Local.   ESTIMATED BLOOD LOSS: Minimal.   INDICATION FOR PROCEDURE: The patient is a 79 year old African-American female who has recurrent advanced ovarian cancer. She has anemia. 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 She will be a very poor candidate for long-term anticoagulation and we are asked to place an IVC filter.   DESCRIPTION OF PROCEDURE: The patient was brought to the Vascular Interventional Radiology Suite. Groins were shaved and prepped, and a sterile surgical field was created. The right femoral vein was visualized and found to be patent. It was then accessed under direct ultrasound guidance without difficulty with a Seldinger needle. A J-wire was placed. After scanning and dilatation the delivery sheath was placed into the inferior vena cava and an inferior venacavogram was performed. The level of the renal vein was at L1, and the filter was deployed at L2.   ____________________________ Annice NeedyJason S. Dew, MD jsd:cbb D: 07/10/2012 09:39:59 ET T: 07/10/2012 09:58:26 ET JOB#: [id number]  cc: Annice NeedyJason S. Dew, MD, <Dictator> Annice NeedyJASON S DEW MD ELECTRONICALLY SIGNED 07/12/2012 9:28

## 2014-11-28 NOTE — Consult Note (Signed)
PATIENT NAME:  Jane [name], Jane [name] MR#:  [id number] DATE OF BIRTH:  1936-02-27  DATE OF CONSULTATION:  07/13/2012  REFERRING PHYSICIAN:  Katharina Caperima Vaickute, MD  CONSULTING PHYSICIAN:  Barnetta ChapelMartin Skulskie, MD/Desmond Tufano Denman GeorgeH. [name], NP  REASON FOR CONSULTATION: To evaluate nausea, vomiting, abdominal pain, dysphasia.   HISTORY OF PRESENT ILLNESS: We appreciate this consult for this 79 year old African American woman with history of end-stage ovarian cancer, recent DVT/PE with Xarelto therapy and IVC placement for evaluation of above. I have noted history and physical, Dr. Milinda CaveFinnegan's note, and Palliative Care note. I also note the patient's antiemetics have been increased similar to her home regimen of Decadron, Haldol, Zofran, Reglan, and scopolamine patch. She was also started on Marinol today due to complaints of abdominal pain, nausea, and vomiting which is felt to be related to her peritoneal carcinomatosis. The patient's son is at bedside and states that she is better today with improved appetite. No vomiting or burping. The patient states she has no abdominal pain, nausea, and has not vomited today. Does state that she still has a little burping associated with heartburn. Son is concerned that swallowing is painful but the patient states that it is not and that she has no difficulty swallowing.   PAST MEDICAL HISTORY:  1. 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 Has been receiving chemotherapy, followed by Dr. Orlie DakinFinnegan. 2. Fibrocystic breast disease. 3. Hypertension. 4. Migraines.  5. Gastroesophageal reflux.   PAST SURGICAL HISTORY:  1. Ovarian cystectomy. 2. Thyroid surgery. 3. Breast biopsy.   ALLERGIES: No known drug allergies.   CURRENT MEDICATIONS:  1. Scopolamine patch transdermal q.72.  2. Protonix 40 p.[name]. b.i.d.  3. Reglan 10 p.[name]. q.6 hours. 4. Oxycodone 5 mg p.[name]. q.4 to 6 p.r.n.  5. Potassium chloride 20 mEq p.[name]. daily.  6. Mag-Ox 400 mg p.[name]. b.i.d.  7. Decadron 10 mg  IV q.12 hours for nausea and vomiting.  8. Haldol 1 mg IV q.12 hours p.r.n. agitation. 9. Zofran 8 mg IV q.12 hours.  10. Reglan 10 mg IV q.6 hours p.r.n.  11. Lisinopril/hydrochlorothiazide 20/25 mg p.[name]. daily.  12. Simvastatin 40 mg p.[name]. at bedtime.  13. Multivitamin 1 tab p.[name]. daily.  14. Calcium with D 600 mg 200 IU 1 p.[name]. daily.   SOCIAL HISTORY: Lives at home with daughters. Has supportive family. No history of alcohol, illicits, or tobacco.   FAMILY HISTORY: Significant for kidney disease, heart problems, colon cancer. Sister with breast cancer. Brother with lung cancer.  REVIEW OF SYSTEMS: Significant for fatigue, weakness, dyspnea on exertion, history of pleuritic chest pain, generalized body aches. Otherwise, 10 point review is negative.  MOST RECENT LABS: Glucose 84, BUN 17, creatinine 0.63, sodium 136, potassium 3.9, GFR greater than 60, magnesium 1.5, total serum protein 5.6, albumin 2.8, total bilirubin 0.5, direct bilirubin 0.1, ALP 59, AST 26, ALT 94, hemoglobin 8.1, hematocrit 25.3, white count 5.0, platelets 133.   PHYSICAL EXAMINATION:   MOST RECENT VITAL SIGNS: Temperature 99, pulse 84, respiratory rate 20, blood pressure 136/85, SAO2 100% on 2 liters nasal cannula.   GENERAL: Chronically ill, relatively well appearing elderly woman in no acute distress lying in bed.   HEENT: Normocephalic, atraumatic. No redness, drainage, or inflammation to the eyes or the nares. Oral mucous membranes are moist, pink.   NECK: No thyromegaly, JVD, or lymphadenopathy.   CHEST: Respirations eupneic.   LUNGS: Clear to auscultation bilaterally. Some decrease to the bases bilaterally.   CARDIOVASCULAR: S1 and S2. Regular rate and  rhythm. No MRG. Port-A-Cath intact on right side of chest, asymptomatic.   ABDOMEN: Rounded abdomen. Bowel sounds x4. Soft, nondistended, nontender. No hepatosplenomegaly, hernias, guarding, rebound tenderness. Does have a somewhat irregular feel to texture  but no gross appreciable masses.   GU: Deferred.   RECTAL: Deferred.   SKIN: Warm, dry, pink. No erythema, lesion, or rash.   EXTREMITIES: Does have generalized weakness x4. Does move extremities against gravity. Sensation intact. No clubbing or cyanosis. Does have some trace lower extremity edema.   NEUROLOGIC: Alert and oriented x3. Cranial nerves II through XII intact. Speech clear. No facial droop. Is somewhat drowsy but awakens easily.   PSYCHIATRIC: Pleasant, cooperative, logical thought.   IMPRESSION AND PLAN: Nausea, vomiting, abdominal pain, questionable dysphagia. This is likely multifactorial given her metastatic cancer and complications. This is, however, improved at present. The patient has been started on her home medications for nausea and vomiting and has started Marinol. Will only add Carafate slowly 1 gram t.i.d. a.c. due to concern of painful swallowing and possibility of irritation from repeated vomiting. Agree with present PPI and antiemetics. We will follow with you.   Thank you for this consult.     These services were provided by Vevelyn Pat, MSN in collaboration with Barnetta Chapel, MD with whom I have discussed this patient in full.   ____________________________ Keturah Barre, NP chl:drc D: 07/13/2012 11:21:09 ET T: 07/13/2012 11:39:16 ET JOB#: [id number]  cc: Keturah Barre, NP, <Dictator> Eustaquio Maize Ajanee Buren FNP ELECTRONICALLY SIGNED 07/13/2012 12:01

## 2014-11-28 NOTE — Consult Note (Signed)
CC ovarian cancer.  Pt EGD with minor findings.  seems that her abd pain and vomiting likely are related to her known ovarian cancer.  Due to constipation and partial blockages I will order dulcolax supp for tonight and prn.  VSS afebrile.  Electronic Signatures: Scot JunElliott, Amilya Haver T (MD)  (Signed on 05-Oct-13 16:03)  Authored  Last Updated: 05-Oct-13 16:03 by Scot JunElliott, Redonna Wilbert T (MD)

## 2014-11-28 NOTE — Consult Note (Signed)
Brief Consult Note: Diagnosis: metastatic ovarian cancer, DVT/likely PE, n/v.   Patient was seen by consultant.   Consult note dictated.   Comments: Appreciate consult for 79 y/o [place] woman  with history of end stage ovarian cancer, recent DVT/PE with Xarelto therapy and IVC placement, for evalation of N/V and abdominal pain. Noted H&P, Dr Milinda CaveFinnegan's note, and palliative care note. Patients antiemetics have been increased, similar to home regimen of decadron, haldol, zofran, reglan, and scopolamine patch. Also was started on Marinol today due to complaints of pain and  NV, which is felt to be related to her peritoneal carcinomatosis. Patient's son is at bedside. States that she is better today, with improved appetite, no vomiting, and less burping. Patient states that she has no abdominal pain, nausea, and has not vomited today. Does state that she still has a little burping associated with heartburn. Son is concerned that swallowing is painful, but patient states it is not and has no difficulty swallowing.  Impression and plan: Nausea and vomiting, abdominal pain. Multifactorial. Improved at present: patient has been started on more of her home medications for NV and has started marinol. Will only add Carafate slurry 1gm tid ac due to concern of painful swallowing and possibility of irritation from repeated vomiting. Agree with present PPI and antiemetics. Will follow with you.Marland Kitchen.  Electronic Signatures: Vevelyn PatLondon, Castella Lerner H (NP)  (Signed 03-Dec-13 08:28)  Authored: Brief Consult Note   Last Updated: 03-Dec-13 08:28 by Keturah BarreLondon, Sherra Kimmons H (NP)

## 2014-11-28 NOTE — Consult Note (Signed)
Chief Complaint:   Subjective/Chief Complaint Patient sleeping, easily aroused.  Currently no nausea, denies abdominal pain or dysphagia.  Patient states her only difficulty with swallowing occurs when she is swallowing large pills.  She states she is feeling better currently.  Please see full GI consult.  Following.   VITAL SIGNS/ANCILLARY NOTES: **Vital Signs.:   02-Dec-13 15:27   Vital Signs Type Routine   Temperature Temperature (F) 99   Celsius 37.2   Temperature Source AdultAxillary   Pulse Pulse 84   Respirations Respirations 20   Systolic BP Systolic BP 136   Diastolic BP (mmHg) Diastolic BP (mmHg) 85   Mean BP 102   Pulse Ox % Pulse Ox % 100   Pulse Ox Activity Level  At rest   Oxygen Delivery Room Air/ 21 %   Electronic Signatures: Barnetta ChapelSkulskie, Martin (MD)  (Signed 02-Dec-13 19:42)  Authored: Chief Complaint, VITAL SIGNS/ANCILLARY NOTES   Last Updated: 02-Dec-13 19:42 by Barnetta ChapelSkulskie, Martin (MD)

## 2014-11-28 NOTE — Consult Note (Signed)
Asked to see patient regarding her DVT and the possibility of an IVC filter.  She has advanced ovarian cancer.  She has AMS and can not provide history.  This is obtained from family.  She is a TEFL teacherJehovah's witness, and starting quite anemic with most recent Hgb 8.6.  She is going to be on anti-coagulation short term, but may not be a good candidate for long term anti-coagulation.  For this reason an IVC filter is reasonable and would place if desired.  Two family member's currently available and they wish to discuss with other family before scheduling.  They will let staff know the decision, and if desired, will be happy to get her on the schedule for filter placement  Electronic Signatures: Annice Needyew, Mairany Bruno S (MD)  (Signed on 29-Nov-13 18:10)  Authored  Last Updated: 29-Nov-13 18:10 by Annice Needyew, Skip Litke S (MD)

## 2014-11-28 NOTE — Consult Note (Signed)
PATIENT NAME:  Jane [name], Jane [name] MR#:  [id number] DATE OF BIRTH:  [birth year]  DATE OF CONSULTATION:  05/14/2012  REFERRING PHYSICIAN:   CONSULTING PHYSICIAN:  Keturah Barre, NP  REASON FOR ADMISSION: Intractable nausea and vomiting.    HISTORY OF PRESENT ILLNESS: Appreciate consult for 79 year old pleasant African American woman with history of stage IV ovarian cancer, H. pylori gastritis, hyperlipidemia, hypertension, migraines and gastroesophageal reflux disease for evaluation of nausea and vomiting. This was ordered by Dr. Orlie Dakin who admitted the patient. Noted patient's history of ovarian cancer diagnosed last year, omentectomy, pleural effusions and chemotherapy with carboplatin and Taxol. States she finished July of this year and scans have not shown any further cancer. CA-125 was normal in July but elevated last month. Did undergo CT scan last month with IV but not oral contrast with small nonspecific ascites and questionable small bowel abnormality but states it was difficult to evaluate for mesenteric implants without oral contrast. Patient reports an onset of nagging upper abdominal pain about a month ago. States it is an 8/10 if she is moving around but gets better when she is still. States onset of nausea about two weeks ago while being treated for urinary tract infection with Levaquin, has not improved since the course is finished. Food makes the nausea worse but not the pain. Has been vomiting foodstuffs once or twice daily. Denies NSAIDs. Feels like her upper abdomen is bloated and has burping. Takes omeprazole 40 mg p.o. daily at home. Was found to have H. pylori on EGD last year, unsure if she was treated or not and chart review is inconclusive. Denies black, bloody, slimy stools, coffee-ground emesis and lower GI complaints. Her last bowel movement was two days ago. Colonoscopy last year revealed one adenomatous polyp.   ALLERGIES: No known allergies.   SOCIAL HISTORY: Lives at  home. No tobacco, illicits or alcohol.   PAST MEDICAL HISTORY:  1. Fibrocystic breast disease.  2. Hyperlipidemia.  3. Hypertension. 4. Migraines. 5. Gastroesophageal reflux disease. 6. Ovarian cystectomy. 7. Thyroid surgery.  8. Breast biopsy. 9. Omentectomy.   FAMILY HISTORY: Kidney failure, coronary artery disease, myocardial infarction, breast cancer, lung cancer, one sister with colorectal cancer.   HOME MEDICATIONS:  1. Reglan 10 mg q.i.d. a.c., at bedtime. 2. Oxycodone 5 mg, 1 p.o. q.4-6h. p.r.n.  3. Potassium chloride 20 mEq 1 p.o. daily.  4. Hydrochlorothiazide/lisinopril 25/20 mg p.o. daily.  5. Simvastatin 40 mg 1 p.o. daily.  6. Multivitamin 1 p.o. daily.  7. Calcium with D 1 p.o. daily.  8. Zofran 4 mg 1 tab p.r.n. q.4 hours. 9. Omeprazole 40 mg 1 tab p.o. daily.   REVIEW OF SYSTEMS: Ten-point review of systems is negative except for what is written above. The patient states she has no other complaints.   LABORATORY, DIAGNOSTIC AND RADIOLOGICAL DATA: Most recent labs: Glucose 89, BUN 8, creatinine 1.1, sodium 123, potassium 4.1, chloride 87, GFR 57, calcium 8.9, total protein 7.2, albumin 3.4, total bilirubin 0.7, alkaline phosphatase 81, AST 41, ALT 31, WBC 7.5. Hemoglobin 9.9 this is stable over the last six months and has ranged between 8.2, and 10.5. Hematocrit 28.7, platelet count 326, MCV 77, MCH 26.2, RDW 15.5, platelet count 326.   PHYSICAL EXAMINATION:  VITAL SIGNS: Most recent vital signs: Temperature 97.7, pulse 78, respiratory rate 20, blood pressure 108/74.   GENERAL: Well appearing pleasant African American woman in no acute distress.   HEENT: Normocephalic, atraumatic. No redness, drainage, or inflammation to the eyes  or the nares. Oral mucous membranes are pink and moist.   NECK: Supple. No lymphadenopathy, thyromegaly, adenopathy.   CHEST: Respirations eupneic. Lungs clear to auscultation bilaterally.   CARDIAC: S1, S2, regular rate and rhythm.  No MRG. Peripheral pulses 2+. No appreciable edema.   ABDOMEN: Protuberant abdomen. Bowel sounds x4. The lower abdomen is soft, nontender. The upper abdomen has some fullness to it. Superior to her midline well-healing scar there is a somewhat irregular surface. She has some mild epigastric tenderness. It is nondistended. Difficult to appreciate hepatosplenomegaly. No rebound tenderness, peritoneal signs or hernias noted.   RECTAL: Deferred.   GENITOURINARY: Deferred.   SKIN: Warm, dry, pink. No erythema, lesion or rash.   EXTREMITIES: Strength 5/5. Moves all extremities well x4. Sensation appears to be intact.   NEUROLOGIC: Alert and oriented x3. Cranial nerves II through XII grossly intact. Speech clear. No facial droop.   PSYCH: Pleasant, cooperative, logical train of thought. Good memory skills.   IMPRESSION AND PLAN: Epigastric pain and nausea. Patient with history of metastatic ovarian cancer, elevated CA-125. Do appreciate abdominal fullness and somewhat irregular surface on exam. This may be related to her omentectomy last year. Has had recent CT, defer repeat scanning to Dr. Orlie DakinFinnegan. She has had several CTs over the last year. Minimal tenderness at present. Nausea and vomiting improved after medications given on arrival to floor. Do recommend stool for H. pylori testing, EGD when clinically feasible and b.i.d. PPI. Of note the patient is hyponatremic and may need her HCTZ adjusted.   These services were provided by Vevelyn Pathristiane Cederic Mozley, MSN, [facility] in collaboration with Dr. Christena DeemMartin U. Skulskie with whom I discussed this patient in full.  ____________________________ Keturah Barrehristiane H. Sanayah Munro, NP chl:cms D: 05/14/2012 08:36:44 ET T: 05/14/2012 08:57:59 ET  JOB#: [id number] cc: Keturah Barrehristiane H. Farrel Guimond, NP, <Dictator> Eustaquio MaizeHRISTIANE H Stacy Deshler FNP ELECTRONICALLY SIGNED 05/29/2012 7:48

## 2014-11-28 NOTE — H&P (Signed)
PATIENT NAME:  Jane [name], Jane [name] MR#:  [id number] DATE OF BIRTH:  1936-08-08  DATE OF ADMISSION:  07/08/2012  ADMITTING PHYSICIAN: Dr. Enid Baas  PRIMARY ONCOLOGIST: Dr. Orlie Dakin  PRIMARY CARE PHYSICIAN: Used to be Dr. Burnett Sheng  CHIEF COMPLAINT: Nausea, vomiting, difficulty breathing.   HISTORY OF PRESENT ILLNESS: Jane [name] is a 79 year old fragile, elderly African American female with past medical history significant for stage IV ovarian cancer with malignant pleural effusion diagnosed in 05/2011 currently on chemotherapy and following with Dr. Orlie Dakin, recent hospitalization for intractable nausea and vomiting from 06/09/2012 and who was just discharged on 11/192013, history of hypertension, hyperlipidemia comes to the hospital secondary to acute on chronic nausea and vomiting symptoms again which worsened in the last couple of days associated with worsening dyspnea on exertion in one day. Patient appears very weak and not able to provide history. Her daughter is at bedside, gives most of the history. According to them patient's ovarian cancer was diagnosed last year. Dr. Wendy Poet has done  ex lap and it was nonsurgical at the time. She was started on chemotherapy from 07/2011 up until 01/2012. In 03/2012 it seems like the cancer was progressing so chemotherapy was restarted. Her last dose of chemotherapy was given when she was in the hospital about two weeks ago. She seems to be having nausea and vomiting for the last six months with acute periods of worsening. According to them she was still nauseous, vomiting got better at the time of discharge but over the last couple of days symptoms worsened again. She walks with a Hoobler at baseline and usually dyspneic after taking a few steps but since today she has been extremely dyspneic and complained of chest pain once. She comes to the ER. CT of the chest could not be done to rule out PE because she has very poor peripheral access and they  couldn't get 20-gauge IV line in her so V/Q is ordered and Doppler's were done and the Doppler's do show right lower extremity DVT so she is being admitted for DVT and possible PE and also symptomatic management of her nausea and vomiting.   PAST MEDICAL HISTORY:  1. 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 Currently receiving chemotherapy, being followed by Dr. Orlie Dakin.  2. Fibrocystic breast disease.  3. Hypertension.  4. Migraines.  5. Gastroesophageal reflux disease.   PAST SURGICAL HISTORY:  1. Ovarian cystectomy.  2. Thyroid surgery.  3. Breast biopsy twice.   ALLERGIES TO MEDICATIONS: No known drug allergies.   CURRENT HOME MEDICATIONS:  1. Scopolamine patch transdermal every 72 hours.  2. Protonix 40 mg p.o. b.i.d.  3. Reglan 10 mg q.6 hours.  4. Oxycodone 5 mg q.4-6 hours p.r.n.  5. Potassium chloride 20 mEq p.o. daily.  6. Magnesium oxide 400 mg p.o. b.i.d.  7. Decadron 10 mg IV q.12 hours for intractable nausea, vomiting.  8. Haldol 1 mg IV every 12 hours as needed for agitation.  9. Zofran 8 mg IV q.12 hours.  10. Reglan 10 mg IV q.6 hours as needed.  11. Lisinopril HCTZ 20/25 mg 1 tablet daily.  12. Simvastatin 40 mg p.o. at bedtime.  13. Multivitamin 1 tablet daily.  14. Calcium vitamin D 600 mg/200 international units 1 tablet p.o. daily.   SOCIAL HISTORY: Patient lives at home with her daughters. No history of any smoking or alcohol use.   FAMILY HISTORY: Mom with kidney disease. Dad with heart problems. Sister with colon cancer and breast cancer  and brother with lung cancer.   REVIEW OF SYSTEMS: CONSTITUTIONAL: No fever. Positive for fatigue and weakness. EYES: No blurred vision, double vision, inflammation, glaucoma, or cataracts. ENT: No tinnitus, ear pain. Mild hearing loss. No discharge, epistaxis or snoring. RESPIRATORY: Positive for dyspnea on exertion. No cough, wheeze, or hemoptysis. CARDIOVASCULAR: Positive for pleuritic chest pain this  morning. No orthopnea, edema, arrhythmia, palpitations, or syncope. GASTROINTESTINAL: Positive for nausea and vomiting. No diarrhea. No abdominal pain, hematemesis, or melena. GENITOURINARY: No dysuria, hematuria, renal calculus, frequency or incontinence. ENDOCRINE: No polyuria, nocturia, thyroid problems, heat or cold intolerance. HEMATOLOGY: No anemia, easy bruising or bleeding. SKIN: No acne, rash or lesions. MUSCULOSKELETAL: Generalized body aches but no arthritis or gout. NEUROLOGIC: No numbness, weakness, cerebrovascular accident, transient ischemic attack, or seizures. PSYCHOLOGICAL: No anxiety, insomnia, depression.   PHYSICAL EXAMINATION:  VITAL SIGNS: Temperature 98.6 degrees Fahrenheit, pulse 125, respirations 19, blood pressure 108/77, pulse ox 100% on room air.  GENERAL: Well built, ill nourished female sitting in bed, not in any acute distress.   HEENT: Normocephalic, atraumatic. Pupils equal, round, reacting to light. Anicteric sclera. Very pale conjunctivae. Oropharynx clear. Dry mucous membranes. No erythema, mass or exudates.   NECK: Supple. No thyromegaly, JVD, or carotid bruits, lymphadenopathy.   LUNGS: Moving air bilaterally. No wheeze or crackles. No use of accessory muscles for breathing.   CARDIOVASCULAR: S1, S2 regular rate and rhythm. No murmurs, rubs or gallops. Port-A-Cath is placed on the right side of the chest.   ABDOMEN: Soft, nontender, nondistended. No hepatosplenomegaly. Normal bowel sounds.   EXTREMITIES: Trace ankle edema. No clubbing or cyanosis. Feeble dorsalis pedis pulses palpable bilaterally.   SKIN: No acne, rash, or lesions.   LYMPHATICS: No cervical lymphadenopathy.   NEUROLOGIC: Cranial nerves intact. No focal motor or sensory deficit.   PSYCH: Patient is awake, alert, oriented x3.   LABORATORY, DIAGNOSTIC AND RADIOLOGICAL DATA: WBC 5.3, hemoglobin 10.2, hematocrit 30.7, platelet count 141.   Sodium 129, potassium 3.5, chloride 94,  bicarbonate 27, BUN 19, creatinine 1.05, glucose 101, calcium 9.0.  ALT 114, AST 43, alkaline phosphatase 70, total bilirubin 0.6, albumin 3.5, CK 26, CK-MB 1.8, troponin less than 0.02. Chest x-ray done today showing atelectasis versus infiltrate versus small effusion in the right lung base and also chronic obstructive pulmonary disease present. Doppler lower extremities showing partially occluded thrombus in right common femoral vein in its mid and distal portions and near complete occlusive thrombus in proximal right superficial vein.   EKG showing sinus tachycardia, heart rate of 109.   ASSESSMENT AND PLAN: 79 year old African American female with history of stage IV ovarian cancer with malignant pleural effusion, recent chemotherapy about 2 to 3 weeks ago while in hospital, acute on chronic nausea and vomiting, brought in for worsening nausea, vomiting and also dyspnea. 1. Dyspnea. Probably has PE. She couldn't get a 20-gauge IV line so CT just is not being done at this time and V/Q is ordered. The Doppler did show lower extremity deep venous thrombosis on the right side. So starting the patient on Xarelto. Also her hemoglobin needs to be watched closely because she is a Jehovah's Witness and does not want blood transfusion. Daughters are at bedside and explained all risks at the time. Patient is a DO NOT RESUSCITATE.  2. Acute on chronic nausea and vomiting, likely from underlying cancer and recent chemotherapy, was in hospital recently with similar symptoms and was hospitalized for almost three weeks for symptom control. Continue Zofran, Reglan, Decadron at  this time.  3. Hypertension. Hold HCTZ as she is hyponatremic. Monitor blood pressure. Continue lisinopril with holding parameters. 4. Hyponatremia, dehydration probably is the cause. Hold HCTZ. on IV fluids 5. Stage IV ovarian cancer on chem. Oncology consult and also palliative care consulted for poor prognosis.  6. GI and deep vein  thrombosis prophylaxis. On Protonix and also Xarelto.  7. CODE STATUS of the patient is DO NOT RESUSCITATE as discussed with patient and daughters at bedside.      TIME SPENT ON ADMISSION: 50 minutes.    ____________________________ Enid Baas, MD rk:cms D: 07/08/2012 16:41:45 ET T: 07/09/2012 05:42:34 ET JOB#: [id number]  cc: Enid Baas, MD, <Dictator> Tollie Pizza. Orlie Dakin, MD Rhona Leavens. Burnett Sheng, MD Enid Baas MD ELECTRONICALLY SIGNED 07/18/2012 13:51

## 2014-11-28 NOTE — Consult Note (Signed)
PATIENT NAME:  Jane [name], Jane [name] MR#:  [id number] DATE OF BIRTH:  1935-10-12  DATE OF CONSULTATION:  07/09/2012  CONSULTING PHYSICIAN: Annice NeedyJason S. Meeghan Skipper, MD   REASON FOR CONSULTATION: Evaluate for IVC filter.   HISTORY OF PRESENT ILLNESS: The patient is a 79 year old African American female with advanced ovarian cancer, stage IV, who was admitted with nausea and vomiting and difficulty breathing. She had a lower extremity venous duplex with extensive lower extremity deep vein thrombosis and a VQ scan with intermediate probability for pulmonary embolus, and so she is being treated for deep vein thrombosis and PE. She has been initiated on anticoagulation but will not be a good candidate for long-term anticoagulation for multiple reasons, including her baseline anemia, and the fact that she is a Jehovah's Witness, her active malignancy and her frailty and poor functional status. For these reasons, and due to the extensive nature of the residual deep vein thrombosis in her leg, an IVC filter is requested. She cannot provide any history, and this is obtained from the previous medical record.   PAST MEDICAL HISTORY:  1. Stage IV ovarian cancer.  2. Fibrocystic breast disease.  3. Hypertension.  4. Migraines.  5. Gastroesophageal reflux disease.   PAST SURGICAL HISTORY:  1. Ovarian cancer.  2. Thyroid surgery.  3. Breast biopsy.   ALLERGIES: None known.   HOME MEDICATIONS:  1. Scopolamine patch.  2. Protonix 40 mg b.i.d.  3. Reglan 10 every six hours.  4. Oxycodone as needed for pain.  5. Potassium chloride 20 mEq daily.  6. Mag oxide 400 mg b.i.d.  7. Decadron 10 mg every 12 hours.  8. Haldol as needed for agitation.  9. Zofran as needed for nausea.  10. Reglan as needed for nausea.  11. Lisinopril/HCTZ 20/25 daily.  12. Simvastatin 40 mg daily.  13. Multivitamin daily.  14. Calcium and vitamin D daily.   SOCIAL HISTORY: Apparently she was living at home with her daughters. No current  tobacco or alcohol use.   FAMILY HISTORY: Mom had kidney disease. Father had heart problems. Multiple siblings with colon, breast and lung cancer.   REVIEW OF SYSTEMS: Review of systems is really not obtainable due to her mental status.   PHYSICAL EXAMINATION:  GENERAL: This is a frail, elderly African American female who was lying in bed but not in acute distress.   VITAL SIGNS: Temperature 98.8, pulse 84, blood pressure 97/60, saturations 100%.   HEENT: Head is normocephalic and atraumatic. Eyes: Sclerae anicteric. Conjunctivae are clear. Ears: Normal external appearance. Difficult to assess hearing.   NECK: Supple without adenopathy or jugular venous distention.   HEART: Regular rate and rhythm with a systolic murmur.   LUNGS: Diminished bilaterally.   ABDOMEN: Soft, nondistended, nontender   EXTREMITIES: Moderate right lower extremity swelling. Mild left lower extremity swelling. Moderate right upper extremity swelling. Capillary refill is present.   NEUROLOGIC: Difficult to assess due to diminished mental status.  LABORATORY, DIAGNOSTIC AND RADIOLOGICAL DATA: Laboratory evaluations reveal sodium 134, potassium 3.0, chloride 100, CO2 227, BUN 23, creatinine 0.97, glucose 79. White blood cell count is 5.5, hemoglobin is 8.6, which is down from 10 on admission, platelet count is 139,000.   ASSESSMENT AND PLAN: This is a 79 year old African American female with extensive deep vein thrombosis and PE suspected on VQ scan who is a very poor candidate for long-term anticoagulation. For this reason, IVC filter is reasonable and will be placed.  We will schedule this for tomorrow. Initially the family  members were hesitant to proceed until they discussed it with all of the other family members, but should they desire to proceed I will plan on proceeding tomorrow morning.   This is a level-4 consultation.   ____________________________ Annice Needy, MD jsd:cbb D: 07/10/2012 09:50:38  ET T: 07/10/2012 10:37:26 ET JOB#: [id number]  cc: Annice Needy, MD, <Dictator> Annice Needy MD ELECTRONICALLY SIGNED 07/12/2012 9:28

## 2014-11-28 NOTE — Discharge Summary (Signed)
PATIENT NAME:  Jane [name], Jane [name] MR#:  [id number] DATE OF BIRTH:  1936/04/29  DATE OF ADMISSION:  07/08/2012 DATE OF DISCHARGE:  07/13/2012  ADMITTING DIAGNOSIS: Dyspnea.   DISCHARGE DIAGNOSES:  1. Dyspnea, suspected pulmonary embolism.  2. Right lower extremity deep vein thrombosis status post IVC filter placement on 07/10/2012 by Dr. Wyn Quakerew.  3. Intractable nausea and vomiting due to topotecan and peritoneal carcinomatosis, according to Dr. Orlie DakinFinnegan.  4. Hypertension. 5. Hyponatremia.  6. Dehydration due to nausea and vomiting as well as HCTZ, which is now on hold.  7. Stage IV ovarian cancer, no more treatment planned.  8. Hypokalemia.  9. Anemia of chronic disease status post Ferroheme injection 06/14/2012.  10. Malnutrition.   DISCHARGE CONDITION: Guarded.   DISCHARGE MEDICATIONS: The patient is to resume her outpatient medications. 1. Multivitamins once daily.  2. Protonix 40 mg p.[name]. twice daily.  3. Scopolamine 1.5 mg transdermal film one patch every 72 hours.  4. Oxycodone 5 mg p.[name]. every 4 to 6 hours as needed.  5. Lisinopril 20 mg p.[name]. twice daily; this is a new medication.  6. Zofran 4 mg p.[name]. every four hours as needed.  7. Senna 8.6 mg p.[name]. at bedtime as needed.  8. Docusate 100 mg p.[name]. twice daily.  9. Dexamethasone 10 mg injection IV twice a day.  10. Haldol 1 mg injection IV twice a day.   NOTE: The patient is not to take hydrochlorothiazide/lisinopril. She is not to take simvastatin, calcium, and vitamin D. She is not to take potassium supplements or Reglan or magnesium oxide.   HOME OXYGEN: None.   DIET: 2 grams salt, as tolerated, soft soup is preferable, mechanical soft.   ACTIVITY LIMITATIONS: As tolerated.   REFERRAL: Hospice at home.   DISCHARGE FOLLOWUP: Follow-up appointment with Dr. Orlie DakinFinnegan in two days after discharge.   CONSULTANTS:  1. Gerarda Fractionimothy Finnegan, MD. 2. Festus BarrenJason Dew, MD.  PROCEDURES: IVC filter placed on 07/10/2012 by Dr. Wyn Quakerew.    CONSULTANTS: 1. Ned GraceNancy Phifer, MD 2. Barnetta ChapelMartin Skulskie, MD 3. Janese BanksSandeep Pandit, MD  RADIOLOGIC STUDIES: Doppler ultrasound of her lower extremities revealed partial occluding thrombus in the right common femoral vein within its mid and distal portions and near complete occluded thrombus within the proximal right superficial femoral vein.   Chest x-ray on 07/08/2012 revealed atelectasis versus infiltrate versus small effusion in the right lung base, chronic obstructive pulmonary disease.  V/Q scan on 07/09/2012 revealed abnormal ventilation perfusion lung scan, areas of decreased ventilation and perfusion which could be secondary to atelectasis or pleural effusion. Possibility of pulmonary embolism is not excluded and there is overall low probability of PE.   HISTORY: The patient is a 79 year old African American female with history of ovarian cancer who undergoes chemotherapy who presented to the hospital with complaints of nausea and vomiting as well as difficulty breathing. Please refer to Dr. Thomasena Edisadhika Kalisetti's admission note on 07/08/2012.   On arrival to the hospital, the patient's temperature was 98.6, pulse 125, respiratory rate 19, blood pressure 108/77, and saturation was 100% on room air. Physical examination was unremarkable.   LABS: On 07/08/2012, the patient's lab data showed glucose of 101 and sodium 129, otherwise BMP was unremarkable. The patient's liver enzymes revealed elevation of AST as well as ALT to 43 and 114, respectively. Cardiac enzymes were unremarkable. The patient's white blood cell count was normal at 5.3, hemoglobin 10.2, platelet count 141, and absolute neutrophil count was normal at 4.3.   Blood cultures taken on 07/08/2012  showed no growth.   Urinalysis was unremarkable, except for 3 red blood cells as well as 3 white blood cells as well as 1+ leukocyte esterase was noted.   EKG done on 07/08/2012 showed sinus tach at 109 beats per minute, normal axis, and no  acute ST-T changes were noted.   HOSPITAL COURSE: The patient was admitted to the hospital for further evaluation. Her chest x-ray was unremarkable. She underwent bilateral lower extremity Doppler's which showed right lower extremity deep vein thrombosis. Because of concern of pulmonary embolism, the patient underwent V/Q scan which was of low probability, however, because of high pretest probability it was felt that the patient could have underlying pulmonary embolism. The patient was initially started on therapy with Xarelto, however, consultation with Dr. Wyn Quaker was obtained and Dr. Wyn Quaker place an IVC filter on 07/10/2012 after which the patient's condition somewhat improved and her shortness of breath resolved. On the day of discharge, 07/13/2012, the patient's oxygen saturation is 99 to 100% on room air at rest.   In regards to nausea and vomiting, the patient was having some intermittent nausea and vomiting and different medications were tried, however, the patient responded well to IV dexamethasone as well as Haldol combined together with scopolamine and Zofran. However, Reglan did not work for the patient and that was discontinued. The patient is to continue scopolamine as well as Zofran hourly and transdermally, however, the patient is to continue dexamethasone as well as Haldol IV. She was evaluated by Dr. Orlie Dakin who felt the patient is declining overall and no further chemotherapy was recommended for her because of concerns that persistent nausea and vomiting could be related to Topotecan chemotherapy medication she was given, as well as possibly also of carcinomatosis of her peritoneal cavity. Dr. Orlie Dakin felt the patient would benefit from hospice at home where the patient will be discharged today, on 07/13/2012.  Her oral intake is somewhat improved with current medications and she is being discharged home to continue food as she tolerates.   In regards to hyponatremia, it was felt that the  patient's hyponatremia as well as mild dehydration was related to HCTZ as well as nausea and vomiting. The patient was given IV fluids and the patient's sodium level normalized.   For hypokalemia, the patient's potassium level was supplemented IV and p.[name].   In regards to anemia of chronic disease, because the patient has DVT as well as suspected pulmonary embolus, anticoagulation in fact would be appropriate for this patient. However, she is a Scientist, product/process development and it was felt that it was more concerning for the patient to have gastrointestinal bleed or any kind of bleeding with anticoagulation. For this reason, IVC filter was placed and anticoagulation was discontinued. The patient is however at risk of clot formation because of her cancerous condition.  In regards to her anemia, the patient's hemoglobin level was slowly drifting from 10.2 on the day of admission, 07/08/2012, to 8.1 on 07/11/2012. No active bleeding was noted. However, the patient's anticoagulation was stopped.   For malnutrition, the patient was advised to continue nutritional supplements if she can tolerate.   The patient is being discharged in stable condition with the above-mentioned medications and follow-up.               Vital signs on the day of discharge: Temperature is 98.2, pulse is ranging from 70s to 100, respiratory rate was 20, blood pressure 125/85, and saturation was 98 to 100% on room air at rest.  TIME SPENT: 40 minutes. ____________________________ Katharina Caper, MD rv:slb D: 07/13/2012 16:00:53 ET T: 07/14/2012 11:59:08 ET JOB#: [id number]  cc: Katharina Caper, MD, <Dictator> Tollie Pizza. Orlie Dakin, MD Katharina Caper MD ELECTRONICALLY SIGNED 07/24/2012 16:14

## 2014-11-28 NOTE — Consult Note (Signed)
PATIENT NAME:  Jane [name], Jane [name] MR#:  [id number] DATE OF BIRTH:  27-Mar-1936  DATE OF CONSULTATION:  07/09/2012  REFERRING PHYSICIAN:  Dr. Nemiah Commander CONSULTING PHYSICIAN:  Advith Martine R. Sherrlyn Hock, MD  REASON FOR CONSULTATION: Stage IV ovarian cancer.   HISTORY OF PRESENT ILLNESS: The patient is a 79 year old female with known history of recurrent metastatic ovarian cancer. The patient had been recently in the hospital with refractory nausea and vomiting and weakness. She also recently completed cycle one topotecan chemotherapy. The patient's son present at bedside also helps provide history. She reportedly did better with vomiting at home for the last one week or so with improved oral intake, but again since yesterday started having nausea and vomiting. She has had some progressive fatigue and weakness. She has had progressive dyspnea on exertion and V/Q scan done reports low probability of PE.  However, lower extremity Doppler shows partially occluding thrombus in the right common femoral vein and near-complete occlusive thrombus in the proximal right superficial femoral vein. The patient has been started on Xarelto. Denies any bleeding symptoms. Denies any acute pain issues at this time.   PAST MEDICAL HISTORY/PAST SURGICAL HISTORY:  1. 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  2. Hypertension.  3. Migraines.  4. Fibrocystic breast disease.  5. Gastroesophageal reflux disease.  6. Ovarian cystectomy.  7. Thyroid surgery.  8. Breast biopsy times two.  FAMILY HISTORY: Noncontributory.   SOCIAL HISTORY: No history of alcohol or smoking.   ALLERGIES: No known drug allergies.     HOME MEDICATIONS:  1. Protonix 40 mg b.i.d.  2. Reglan 10 mg q. 6 hours.  3. Oxycodone 5 mg q. 4 to 6 hours p.r.n.  4. Potassium chloride 20 mEq daily.  5. Magnesium oxide 400 mg b.i.d.  6. Scopolamine patch transdermal q. 72 hours.  7. Lisinopril/HCTZ 20/25 mg 1 daily.  8. Simvastatin 40  mg at bedtime.  9. Multivitamin 1 daily.  10. Calcium with vitamin D 600/200 units, 1 daily.   REVIEW OF SYSTEMS: CONSTITUTIONAL: Generalized weakness and fatigue. Dyspnea on exertion. No fever or chills. HEENT: Denies headaches or dizziness at rest. No epistaxis, ear or jaw pain. CARDIAC: No angina, palpitations, orthopnea, or paroxysmal nocturnal dyspnea. LUNGS: Has dyspnea on exertion with mild cough. Dry. No hemoptysis or chest pain. GI: As in history of present illness. In addition, no diarrhea or bright red blood in stools. No melena. GU: No dysuria or hematuria. MUSCULOSKELETAL: No new bone pains. NEUROLOGIC: No new focal weakness, seizures, or loss of consciousness. ENDOCRINE: No polyuria or polydipsia.   PHYSICAL EXAMINATION:  GENERAL: The patient is weak and tired looking, resting in bed, otherwise awake and oriented and converses appropriately. No icterus. Mild pallor.  VITAL SIGNS: Temperature 98.8, pulse 84, respirations 18, blood pressure 97/68, saturation 100% on room air.   HEENT: Normocephalic, atraumatic. Extraocular movements intact. No oral thrush.   NECK: Negative for lymphadenopathy.  CARDIOVASCULAR: S1, S2, regular rate and rhythm.   LUNGS: Lungs show bilateral decreased breath sounds at bases, no rhonchi.   ABDOMEN: Soft, mildly distended, nonspecific tenderness in the lower quadrants. No guarding or rigidity. Bowel sounds present.   EXTREMITIES: Mild pedal edema. Has mild right upper extremity edema also (per discussion with R.N. she had infiltration at IV site in this area).   NEUROLOGIC: Limited examination. Cranial nerves seem intact. Moves all extremities spontaneously.  SKIN: No generalized rashes or major bruising.   MUSCULOSKELETAL: No obvious joint deformity or swelling.   LABORATORY,  DIAGNOSTIC, AND RADIOLOGICAL DATA: WBC 5500, hemoglobin 8.6, platelets 139, ANC 3400, creatinine 0.97, potassium 3, calcium 8.4. Blood culture negative so far.    IMPRESSION AND RECOMMENDATION: 79 year old female patient with stage IV ovarian cancer, recurrent/refractory nausea and vomiting, admitted with recurrent vomiting and weakness along with dyspnea on exertion. Work-up so far shows possible right lower extremity deep venous thrombosis. The patient has already been started on Xarelto for anticoagulation. Given significant deep vein thrombosis, high risk for recurrent thromboembolic phenomena, uncertainty of adequate oral anticoagulation given her recurrent GI symptoms, will consult vascular surgeon Dr. Wyn Quakerew to see if IVC filter can be placed. Otherwise V/Q scan has been reported as low probability for PE. Continue current supportive treatment. No acute pain issues at this time. The patient is DO NOT RESUSCITATE. Oncology will continue to follow as needed during the hospitalization. Continue to monitor CBC and transfuse as indicated. The patient and son explained above, agreeable to this plan.   Thank you for the referral. Please feel free to contact me if any additional questions.     ____________________________ Maren ReamerSandeep R. Sherrlyn HockPandit, MD srp:bjt D: 07/09/2012 16:44:13 ET T: 07/09/2012 17:36:40 ET JOB#: [id number]  cc: Mabeline Varas R. Sherrlyn HockPandit, MD, <Dictator> Wille CelesteSANDEEP R Mallie Linnemann MD ELECTRONICALLY SIGNED 07/10/2012 0:09

## 2014-12-03 NOTE — Discharge Summary (Signed)
PATIENT NAME:  Jane [name], Jane [name] MR#:  [id number] DATE OF BIRTH:  [birth year]  DATE OF ADMISSION:  07/08/2011 DATE OF DISCHARGE:  07/15/2011  DIAGNOSIS ON ADMISSION: Peritoneal carcinomatosis.   DIAGNOSIS ON DISCHARGE: Peritoneal carcinomatosis not amenable to tumor reductive surgery.   PROCEDURE PERFORMED: Exploratory laparotomy.   COMPLICATION: Prolonged postoperative ileus.   PERTINENT HISTORY: [name] is a 79 year old patient who presented with signs of peritoneal carcinomatosis. Therefore, decision was made to proceed with exploratory laparotomy and hopefully tumor reductive surgery.   HOSPITAL COURSE: At the time of surgery, there was extensive peritoneal carcinomatosis with beginning encasement of the small bowel. Tumor reductive surgery was not possible. Therefore, only biopsies were taken. There were no intraoperative complications. The postoperative course was delayed due to a significant postoperative ileus with slow re-establishment of limited bowel function. 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   At the time of discharge, [name] had adequate pain control with oral medication and could tolerate a bland diet in small amounts. She received careful instructions as for home care and will be seen again in the clinic in one week for discussion of further treatment.  ____________________________ Maxine GlennBrigitte E. Edmund Holcomb, MD bem:drc D: 07/29/2011 20:06:57 ET T: 07/30/2011 13:30:19 ET JOB#: [id number]  cc: Maxine GlennBrigitte E. Jenilee Franey, MD, <Dictator> Maxine GlennBRIGITTE E Keaundre Thelin MD ELECTRONICALLY SIGNED 08/19/2011 19:24
